# Patient Record
Sex: Female | Born: 1990 | Race: Black or African American | Hispanic: No | Marital: Single | Smoking: Never smoker
Health system: Southern US, Community
[De-identification: ages and names within clinical notes are randomized; demographics above are authoritative.]

## PROBLEM LIST (undated history)

## (undated) DIAGNOSIS — I10 Essential (primary) hypertension: Secondary | ICD-10-CM

## (undated) DIAGNOSIS — N730 Acute parametritis and pelvic cellulitis: Secondary | ICD-10-CM

## (undated) DIAGNOSIS — D649 Anemia, unspecified: Secondary | ICD-10-CM

## (undated) DIAGNOSIS — A7481 Chlamydial peritonitis: Secondary | ICD-10-CM

## (undated) DIAGNOSIS — A609 Anogenital herpesviral infection, unspecified: Secondary | ICD-10-CM

## (undated) DIAGNOSIS — A6000 Herpesviral infection of urogenital system, unspecified: Secondary | ICD-10-CM

## (undated) HISTORY — DX: Herpesviral infection of urogenital system, unspecified: A60.00

## (undated) HISTORY — DX: Anogenital herpesviral infection, unspecified: A60.9

## (undated) HISTORY — DX: Chlamydial peritonitis: A74.81

## (undated) HISTORY — DX: Acute parametritis and pelvic cellulitis: N73.0

---
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
## 2008-05-12 ENCOUNTER — Ambulatory Visit: Payer: Self-pay | Admitting: Internal Medicine

## 2008-05-12 DIAGNOSIS — R519 Headache, unspecified: Secondary | ICD-10-CM | POA: Insufficient documentation

## 2008-05-12 DIAGNOSIS — R51 Headache: Secondary | ICD-10-CM

## 2008-05-12 DIAGNOSIS — M79609 Pain in unspecified limb: Secondary | ICD-10-CM | POA: Insufficient documentation

## 2008-09-14 ENCOUNTER — Ambulatory Visit: Payer: Self-pay | Admitting: Internal Medicine

## 2009-05-15 ENCOUNTER — Telehealth (INDEPENDENT_AMBULATORY_CARE_PROVIDER_SITE_OTHER): Payer: Self-pay | Admitting: *Deleted

## 2009-05-30 ENCOUNTER — Ambulatory Visit: Payer: Self-pay | Admitting: Internal Medicine

## 2009-05-30 LAB — CONVERTED CEMR LAB: Anti Nuclear Antibody(ANA): NEGATIVE

## 2009-06-07 LAB — CONVERTED CEMR LAB
AST: 17 units/L (ref 0–37)
Albumin: 4.3 g/dL (ref 3.5–5.2)
Alkaline Phosphatase: 72 units/L (ref 39–117)
BUN: 9 mg/dL (ref 6–23)
Basophils Relative: 0 % (ref 0.0–3.0)
CO2: 30 meq/L (ref 19–32)
Calcium: 9.3 mg/dL (ref 8.4–10.5)
Chloride: 105 meq/L (ref 96–112)
Creatinine, Ser: 0.8 mg/dL (ref 0.4–1.2)
Eosinophils Relative: 10.2 % — ABNORMAL HIGH (ref 0.0–5.0)
Ferritin: 15 ng/mL (ref 10.0–291.0)
Folate: 10 ng/mL
Glucose, Bld: 82 mg/dL (ref 70–99)
HCT: 37.1 % (ref 36.0–46.0)
LDL Cholesterol: 89 mg/dL (ref 0–99)
Lymphs Abs: 1.8 10*3/uL (ref 0.7–4.0)
Monocytes Relative: 9.2 % (ref 3.0–12.0)
Neutrophils Relative %: 43.7 % (ref 43.0–77.0)
Platelets: 319 10*3/uL (ref 150.0–400.0)
RBC: 4.54 M/uL (ref 3.87–5.11)
Saturation Ratios: 27.2 % (ref 20.0–50.0)
Sed Rate: 13 mm/hr (ref 0–22)
TSH: 0.46 microintl units/mL (ref 0.35–5.50)
Total Bilirubin: 0.7 mg/dL (ref 0.3–1.2)
Total CHOL/HDL Ratio: 3
Triglycerides: 72 mg/dL (ref 0.0–149.0)
WBC: 4.8 10*3/uL (ref 4.5–10.5)

## 2009-08-21 ENCOUNTER — Ambulatory Visit: Payer: Self-pay | Admitting: Internal Medicine

## 2009-10-21 ENCOUNTER — Ambulatory Visit: Payer: Self-pay | Admitting: Internal Medicine

## 2010-01-26 ENCOUNTER — Ambulatory Visit: Payer: Self-pay | Admitting: Internal Medicine

## 2010-01-26 LAB — CONVERTED CEMR LAB
Nitrite: NEGATIVE
Urobilinogen, UA: 1

## 2010-07-04 ENCOUNTER — Telehealth: Payer: Self-pay | Admitting: Internal Medicine

## 2010-07-04 ENCOUNTER — Ambulatory Visit: Payer: Self-pay | Admitting: Family Medicine

## 2010-07-04 DIAGNOSIS — F430 Acute stress reaction: Secondary | ICD-10-CM | POA: Insufficient documentation

## 2010-07-06 ENCOUNTER — Encounter: Admission: RE | Admit: 2010-07-06 | Discharge: 2010-07-06 | Payer: Self-pay | Admitting: Family Medicine

## 2010-08-04 ENCOUNTER — Ambulatory Visit: Payer: Self-pay | Admitting: Family Medicine

## 2010-08-04 DIAGNOSIS — R319 Hematuria, unspecified: Secondary | ICD-10-CM

## 2010-08-04 LAB — CONVERTED CEMR LAB
Beta hcg, urine, semiquantitative: NEGATIVE
Glucose, Urine, Semiquant: NEGATIVE
Specific Gravity, Urine: 1.015
WBC Urine, dipstick: NEGATIVE
pH: 6.5

## 2010-08-23 ENCOUNTER — Telehealth: Payer: Self-pay | Admitting: *Deleted

## 2010-08-24 ENCOUNTER — Encounter: Payer: Self-pay | Admitting: Internal Medicine

## 2010-08-24 ENCOUNTER — Ambulatory Visit: Payer: Self-pay | Admitting: Family Medicine

## 2010-08-24 ENCOUNTER — Other Ambulatory Visit: Admission: RE | Admit: 2010-08-24 | Discharge: 2010-08-24 | Payer: Self-pay | Admitting: Family Medicine

## 2010-08-24 DIAGNOSIS — N926 Irregular menstruation, unspecified: Secondary | ICD-10-CM

## 2010-08-24 DIAGNOSIS — N921 Excessive and frequent menstruation with irregular cycle: Secondary | ICD-10-CM | POA: Insufficient documentation

## 2010-08-24 LAB — CONVERTED CEMR LAB
Basophils Absolute: 0 10*3/uL (ref 0.0–0.1)
Hemoglobin: 11.8 g/dL — ABNORMAL LOW (ref 12.0–15.0)
Lymphocytes Relative: 32.5 % (ref 12.0–46.0)
Monocytes Relative: 8.8 % (ref 3.0–12.0)
Neutrophils Relative %: 52.7 % (ref 43.0–77.0)
Platelets: 350 10*3/uL (ref 150.0–400.0)
RDW: 13.9 % (ref 11.5–14.6)

## 2010-08-25 ENCOUNTER — Encounter: Payer: Self-pay | Admitting: Family Medicine

## 2010-08-28 ENCOUNTER — Telehealth (INDEPENDENT_AMBULATORY_CARE_PROVIDER_SITE_OTHER): Payer: Self-pay | Admitting: *Deleted

## 2010-08-28 LAB — CONVERTED CEMR LAB
HIV: NONREACTIVE
Preg, Serum: NEGATIVE

## 2010-08-29 LAB — CONVERTED CEMR LAB

## 2010-10-01 ENCOUNTER — Ambulatory Visit: Payer: Self-pay | Admitting: Internal Medicine

## 2010-11-02 ENCOUNTER — Telehealth: Payer: Self-pay | Admitting: Internal Medicine

## 2010-11-02 ENCOUNTER — Ambulatory Visit
Admission: RE | Admit: 2010-11-02 | Discharge: 2010-11-02 | Payer: Self-pay | Source: Home / Self Care | Attending: Internal Medicine | Admitting: Internal Medicine

## 2010-11-02 DIAGNOSIS — A6000 Herpesviral infection of urogenital system, unspecified: Secondary | ICD-10-CM | POA: Insufficient documentation

## 2010-11-02 DIAGNOSIS — R1013 Epigastric pain: Secondary | ICD-10-CM | POA: Insufficient documentation

## 2010-11-02 DIAGNOSIS — J020 Streptococcal pharyngitis: Secondary | ICD-10-CM | POA: Insufficient documentation

## 2010-11-02 HISTORY — DX: Herpesviral infection of urogenital system, unspecified: A60.00

## 2010-11-02 LAB — CONVERTED CEMR LAB
Blood in Urine, dipstick: NEGATIVE
Nitrite: NEGATIVE
Protein, U semiquant: NEGATIVE
pH: 5.5

## 2010-11-03 ENCOUNTER — Encounter: Payer: Self-pay | Admitting: Internal Medicine

## 2010-11-06 ENCOUNTER — Encounter: Payer: Self-pay | Admitting: *Deleted

## 2010-12-02 LAB — CONVERTED CEMR LAB
Albumin: 3.9 g/dL (ref 3.5–5.2)
Alkaline Phosphatase: 57 units/L (ref 39–117)
Basophils Absolute: 0 10*3/uL (ref 0.0–0.1)
Calcium: 9 mg/dL (ref 8.4–10.5)
Eosinophils Absolute: 0.6 10*3/uL (ref 0.0–0.7)
Eosinophils Relative: 10.6 % — ABNORMAL HIGH (ref 0.0–5.0)
GFR calc non Af Amer: 136.07 mL/min (ref >60)
Glucose, Bld: 82 mg/dL (ref 70–99)
HCT: 34.5 % — ABNORMAL LOW (ref 36.0–46.0)
Lymphs Abs: 1.9 10*3/uL (ref 0.7–4.0)
MCHC: 32.6 g/dL (ref 30.0–36.0)
MCV: 83.9 fL (ref 78.0–100.0)
Monocytes Absolute: 0.4 10*3/uL (ref 0.1–1.0)
Platelets: 312 10*3/uL (ref 150.0–400.0)
RDW: 13.4 % (ref 11.5–14.6)
Sodium: 139 meq/L (ref 135–145)

## 2010-12-04 NOTE — Letter (Signed)
Summary: Out of Work  Adult nurse at [employer]  [address]   [place], [place] [address]   Phone: [phone]  Fax: [phone]    July 04, 2010   Employee:  MIKAELLA ESCALONA    To Whom It May Concern:   For Medical reasons, please excuse the above named employee from work for the following dates:  Start:    End:    If you need additional information, please feel free to contact our office.         Sincerely,    Danise Edge MD

## 2010-12-04 NOTE — Progress Notes (Signed)
Summary: [facility] called and said they are missing 2 test  Phone Note From Other Clinic Call back at [phone] option #2     Caller: [facility]      - Adina Summary of Call: [facility] called and says that they are missing wet prep and the Gonnorhea and chlamydia test. Pls advise.   Initial call taken by: Lucy Antigua,  August 28, 2010 9:27 AM  Follow-up for Phone Call        Will call [facility]? Not sure why they are waiting for this? This was all sent to [facility]. Follow-up by: Josph Macho RMA,  August 28, 2010 9:42 AM  Additional Follow-up for Phone Call Additional follow up Details #1::        Informed Adina to disregard this. Additional Follow-up by: Josph Macho RMA,  August 28, 2010 10:59 AM

## 2010-12-04 NOTE — Assessment & Plan Note (Signed)
Summary: CONJUCTIVITIS? // RS   Vital Signs:  Patient profile:   20 year old female Menstrual status:  regular LMP:     09/09/2010 Weight:      200 pounds O2 Sat:      98 % on Room air Temp:     99.0 degrees F oral Pulse rate:   70 / minute BP sitting:   100 / 70  (left arm) Cuff size:   regular  Vitals Entered By: Romualdo Bolk, CMA (AAMA) (October 01, 2010 3:56 PM)  O2 Flow:  Room air CC: Left eye watering, itching, some pain. This has been going on 11/24. LMP (date): 09/09/2010 LMP - Character: heavy Menarche (age onset years): 9   Menses interval (days): 21-28 Menstrual flow (days): 7 Enter LMP: 09/09/2010 Last PAP Result NEGATIVE FOR INTRAEPITHELIAL LESIONS OR MALIGNANCY.   History of Present Illness: Alyssa Lloyd comes in today   with her mom and sis whom are ill with  osre throat and fever for above.  She has had some redness and itching left eye as  above . SHe has had her contacts out for a week   ? new mascara  related   with left itchy lids  ..used visine . and moitsurizer .vision feels blurry  no  double vision..  NO fever uri signs or st.  rash  Preventive Screening-Counseling & Management  Alcohol-Tobacco     Smoking Status: never     Passive Smoke Exposure: no  Current Medications (verified): 1)  Ranitidine Hcl 150 Mg Caps (Ranitidine Hcl) .Marland Kitchen.. 1 Cap By Mouth Two Times A Day As Needed Reflux 2)  Alprazolam 0.25 Mg Tabs (Alprazolam) .... 1/2 To 1 Tab By Mouth Two Times A Day As Needed/anxiety/sob 3)  Valtrex 500 Mg Tabs (Valacyclovir Hcl) .Marland Kitchen.. 1 Daily  Allergies (verified): No Known Drug Allergies  Past History:  Past medical, surgical, family and social histories (including risk factors) reviewed for relevance to current acute and chronic problems.  Past Medical History: Reviewed history from 09/14/2008 and no changes required. non contibutory 5#9 oz FT    Past History:  Care Management: None Current  Family History: Reviewed  history from 05/30/2009 and no changes required. Fam hx HTN Fam hx Diabetes Fam hx Astma Fam hx Anemia Fam hx Allergies ? HAs   Neg RLS  Social History: Reviewed history from 01/26/2010 and no changes required. ? ets,  mom smokes  non smoker  works [employer]   Review of Systems  The patient denies anorexia, fever, weight loss, vision loss, decreased hearing, hoarseness, and chest pain.    Physical Exam  General:  Well-developed,well-nourished,in no acute distress; alert,appropriate and cooperative throughout examination Head:  normocephalic and atraumatic.   Eyes:  left eye  with 1 = redness and watery  discharge perrl and no photophobia  no ciliary flush Ears:  R ear normal and L ear normal.   Nose:  no nasal discharge.   Mouth:  pharynx pink and moist.   Neck:  No deformities, masses, or tenderness noted. Lungs:  normal respiratory effort and no intercostal retractions.   Cervical Nodes:  No lymphadenopathy noted Psych:  Oriented X3, good eye contact, not anxious appearing, and not depressed appearing.     Impression & Recommendations:  Problem # 1:  CONJUNCTIVITIS, LEFT (ICD-372.30) ? cause  ? allergic    ? keratitis but atypical for this.  rec   rx and observation .   see eye doc if persistent or  progressive  .  disc with mom also.  Complete Medication List: 1)  Ranitidine Hcl 150 Mg Caps (Ranitidine hcl) .Marland Kitchen.. 1 cap by mouth two times a day as needed reflux 2)  Alprazolam 0.25 Mg Tabs (Alprazolam) .... 1/2 to 1 tab by mouth two times a day as needed/anxiety/sob 3)  Valtrex 500 Mg Tabs (Valacyclovir hcl) .Marland Kitchen.. 1 daily 4)  Pataday 0.2 % Soln (Olopatadine hcl) .Marland Kitchen.. 1 qtt left eye  two times a day or as directed  Patient Instructions: 1)  no contacts until better . 2)  use allergy eye drops  If increasing pain or  infection looking then may need to see your eye doctor or return visit.  Prescriptions: PATADAY 0.2 % SOLN (OLOPATADINE HCL) 1 qtt left eye  two times a  day or as directed  #1 x 0   Entered and Authorized by:   Madelin Headings MD   Signed by:   Madelin Headings MD on 10/01/2010   Method used:   Electronically to        [facility]* (retail)       [address]       [address]       [place], [place]  [address]       Ph: [phone]       Fax: [phone]   RxID:   [id number]    Orders Added: 1)  Est. Patient Level III [84132]

## 2010-12-04 NOTE — Assessment & Plan Note (Signed)
Summary: chest pain/  pt went to [facility] ER/dm   Vital Signs:  Patient profile:   20 year old female Menstrual status:  regular Height:      63.5 inches (161.29 cm) Weight:      202 pounds (91.82 kg) BMI:     35.35 O2 Sat:      99 % on Room air Temp:     98.2 degrees F (36.78 degrees C) oral Pulse rate:   63 / minute BP sitting:   118 / 84  (left arm) Cuff size:   large  Vitals Entered By: Josph Macho RMA (July 04, 2010 10:08 AM)  O2 Flow:  Room air CC: Chest pain X5 days- left arm went numb , SOB/ CF Is Patient Diabetic? No   History of Present Illness: Patient in today for evaluation of some recent episodes of CP/SOB. She is accompanied by her mother. She reports the first episode was frightening and today's episode was frightening some milder episodes imbetween were not as frightening. The first episode occured at work on Saturday, she had just finished a shift that had been very stressful. She has just been given a promotion and some of her co workers were being harsh. She had a hard, sharp pain in her LUQ/lower anterior left CW. She then felt SOB/palp/numbness and tingling in her hands. She felt as if she could pass out, but she did not. Between today and then she has had much more mild symptoms only in the evening. Just noting somemild LUQ discomfort without the associated symptoms. She has a long history of Reflux with dyspepsiaand discomfort at times. It has been increased lately and she is drinking several caffeinated sodas daily, denies cigarette use, drinks small amounts of alcohol infrequently. Today she was at work again today when she had a hard pain, with SOB/palp and tingling in fingers again after another conflict with coworkers. No f/c/malaise/GI or GU c/o. No FH of early cardiac disease. Her mother did have a cholecystectomy in early 30s  Current Medications (verified): 1)  None  Allergies (verified): No Known Drug Allergies  Past History:  Past  medical history reviewed for relevance to current acute and chronic problems. Social history (including risk factors) reviewed for relevance to current acute and chronic problems.  Past Medical History: Reviewed history from 09/14/2008 and no changes required. non contibutory 5#9 oz FT    Social History: Reviewed history from 01/26/2010 and no changes required. ? ets,  mom smokes  non smoker  works Software engineer   Review of Systems      See HPI  Physical Exam  General:  Well-developed,well-nourished,in no acute distress; alert,appropriate and cooperative throughout examination Head:  Normocephalic and atraumatic without obvious abnormalities. No apparent alopecia or balding. Eyes:  No corneal or conjunctival inflammation noted. EOMI.  Ears:  External ear exam shows no significant lesions or deformities.  Otoscopic examination reveals clear canals, tympanic membranes are intact bilaterally without bulging, retraction, inflammation or discharge. Hearing is grossly normal bilaterally. Nose:  External nasal examination shows no deformity or inflammation. Nasal mucosa are pink and moist without lesions or exudates. Mouth:  Oral mucosa and oropharynx without lesions or exudates.  Teeth in good repair. Neck:  No deformities, masses, or tenderness noted. Lungs:  Normal respiratory effort, chest expands symmetrically. Lungs are clear to auscultation, no crackles or wheezes. Heart:  Normal rate and regular rhythm. S1 and S2 normal without gallop, murmur, click, rub or other extra sounds. Abdomen:  Bowel  sounds positive,abdomen soft and non-tender without masses, organomegaly or hernias noted. Msk:  No deformity or scoliosis noted of thoracic or lumbar spine.   Pulses:  R and L,dorsalis pedis and posterior tibial pulses are full and equal bilaterally Extremities:  No clubbing, cyanosis, edema, or deformity noted  Cervical Nodes:  No lymphadenopathy noted Psych:  Cognition and judgment appear  intact. Alert and cooperative with normal attention span and concentration. No apparent delusions, illusions, hallucinations   Impression & Recommendations:  Problem # 1:  ABDOMINAL PAIN, EPIGASTRIC (ICD-789.06)  Orders: TLB-CBC Platelet - w/Differential (85025-CBCD) TLB-BMP (Basic Metabolic Panel-BMET) (80048-METABOL) TLB-Hepatic/Liver Function Pnl (80076-HEPATIC) TLB-TSH (Thyroid Stimulating Hormone) (84443-TSH) TLB-H. Pylori Abs(Helicobacter Pylori) (86677-HELICO) Radiology Referral (Radiology), check an Ultrasound of RUQ, mother with early gallbladder disease Specimen Handling ([address]) Venipuncture ([address])  Problem # 2:  SHORTNESS OF BREATH (ICD-786.05)  Orders: TLB-BMP (Basic Metabolic Panel-BMET) (80048-METABOL) TLB-Hepatic/Liver Function Pnl (80076-HEPATIC) TLB-TSH (Thyroid Stimulating Hormone) (84443-TSH) Specimen Handling ([address]) Venipuncture (91478) Likely related to anxiety but will report worsening symptoms  Problem # 3:  STRESS REACTION, ACUTE (ICD-308.9) Likely situational and counselled at length regarding the need for life planning and decrease of stressors wehre ever possible. Given just 10 alprazolam to use as needed for panic moments. Warned regarding the need to use them sparingly due to habituation potential  Complete Medication List: 1)  Ranitidine Hcl 150 Mg Caps (Ranitidine hcl) .Marland Kitchen.. 1 cap by mouth two times a day as needed reflux 2)  Alprazolam 0.25 Mg Tabs (Alprazolam) .... 1/2 to 1 tab by mouth two times a day as needed/anxiety/sob 3)  Zofran Odt 4 Mg Tbdp (Ondansetron) .Marland Kitchen.. 1 tab by mouth three times a day as needed n/v Prescriptions: ZOFRAN ODT 4 MG TBDP (ONDANSETRON) 1 tab by mouth three times a day as needed n/v  #30 x 1   Entered and Authorized by:   Danise Edge MD   Signed by:   Danise Edge MD on 07/04/2010   Method used:   Electronically to        [facility]* (retail)       [address]       [address]  Shipping/mailing       [place], [place]  [address]       Ph: [phone]       Fax: [phone]   RxID:   [id number] ALPRAZOLAM 0.25 MG TABS (ALPRAZOLAM) 1/2 to 1 tab by mouth two times a day as needed/anxiety/sob  #10 x 0   Entered and Authorized by:   Danise Edge MD   Signed by:   Danise Edge MD on 07/04/2010   Method used:   Print then Give to Patient   RxID:   [id number] RANITIDINE HCL 150 MG CAPS (RANITIDINE HCL) 1 cap by mouth two times a day as needed reflux  #60 x 1   Entered and Authorized by:   Danise Edge MD   Signed by:   Danise Edge MD on 07/04/2010   Method used:   Electronically to        [facility]* (retail)       [address]       [address]mail[address]       [place], [place]  [address]       Ph: [phone]       Fax: [phone]   RxID:   [id number]

## 2010-12-04 NOTE — Assessment & Plan Note (Signed)
Summary: stomach pain/njr   Vital Signs:  Patient profile:   20 year old female Menstrual status:  regular Height:      63.5 inches (161.29 cm) Weight:      198 pounds (90.00 kg) O2 Sat:      99 % on Room air Temp:     98.0 degrees F (36.67 degrees C) oral Pulse rate:   52 / minute BP sitting:   118 / 80  (left arm) Cuff size:   large  Vitals Entered By: Josph Macho RMA (August 04, 2010 9:31 AM)  O2 Flow:  Room air CC: Stomach pains X1week, nausea, vomited a couple times, bowel movements-normal, lower abd/ CF Is Patient Diabetic? No   History of Present Illness: Patient in today for evaluation of symptoms she has had for about a week. She started with n/v a week ago. The vomitting stopped but she is having lower abdominal pain. Notes her bowels are moving well, no bloody or tarry stool. She does note some mild dysuria, suprapubic pain. Has had some sensation of low grade fevers/chills. She does not notice any CP/palp/SOB/congestion. Her LMP was 2 weeks ago was shorter and lighter than usual at just 4 days instead of 7 she did have unprotected sex about 2 1/2 weeks ago. She continues to feel nauseous and her last episode of vomitting was roughly 3 days ago. She reports some scant blood on the tissure when she wipes of and on this week.  Current Medications (verified): 1)  Ranitidine Hcl 150 Mg Caps (Ranitidine Hcl) .Marland Kitchen.. 1 Cap By Mouth Two Times A Day As Needed Reflux 2)  Alprazolam 0.25 Mg Tabs (Alprazolam) .... 1/2 To 1 Tab By Mouth Two Times A Day As Needed/anxiety/sob 3)  Zofran Odt 4 Mg Tbdp (Ondansetron) .Marland Kitchen.. 1 Tab By Mouth Three Times A Day As Needed N/v  Allergies (verified): No Known Drug Allergies  Past History:  Past medical history reviewed for relevance to current acute and chronic problems. Social history (including risk factors) reviewed for relevance to current acute and chronic problems.  Past Medical History: Reviewed history from 09/14/2008 and no changes  required. non contibutory 5#9 oz FT    Social History: Reviewed history from 01/26/2010 and no changes required. ? ets,  mom smokes  non smoker  works Software engineer   Review of Systems      See HPI  Physical Exam  General:  Well-developed,well-nourished,in no acute distress; alert,appropriate and cooperative throughout examination Head:  Normocephalic and atraumatic without obvious abnormalities. No apparent alopecia or balding. Mouth:  Oral mucosa and oropharynx without lesions or exudates.  Teeth in good repair. slightly dry mucus membranes Neck:  No deformities, masses, or tenderness noted. Lungs:  Normal respiratory effort, chest expands symmetrically. Lungs are clear to auscultation, no crackles or wheezes. Heart:  Normal rate and regular rhythm. S1 and S2 normal without gallop, murmur, click, rub or other extra sounds. Abdomen:  Bowel sounds positive,abdomen soft and non-tender without masses, organomegaly or hernias noted. Extremities:  No clubbing, cyanosis, edema, or deformity noted with normal full range of motion of all joints.   Psych:  Cognition and judgment appear intact. Alert and cooperative with normal attention span and concentration. No apparent delusions, illusions, hallucinations   Impression & Recommendations:  Problem # 1:  HEMATURIA UNSPECIFIED (ICD-599.70)  Her updated medication list for this problem includes:    Macrobid 100 Mg Caps (Nitrofurantoin monohyd macro) .Marland Kitchen... 1 cap by mouth two times a day x  5 days Needs to push clear fluids due to mild dehydration  Problem # 2:  NAUSEA AND VOMITING (ICD-787.01) Reminded that she has Zofran on hold at the pharmacy if she needs it needs to maintain a bland diet for next one to two days  Complete Medication List: 1)  Ranitidine Hcl 150 Mg Caps (Ranitidine hcl) .Marland Kitchen.. 1 cap by mouth two times a day as needed reflux 2)  Alprazolam 0.25 Mg Tabs (Alprazolam) .... 1/2 to 1 tab by mouth two times a day as  needed/anxiety/sob 3)  Zofran Odt 4 Mg Tbdp (Ondansetron) .Marland Kitchen.. 1 tab by mouth three times a day as needed n/v 4)  Macrobid 100 Mg Caps (Nitrofurantoin monohyd macro) .Marland Kitchen.. 1 cap by mouth two times a day x 5 days  Patient Instructions: 1)  Drink clear liquids only for the next 24 hours, then slowly add other liquids and food as you tolerate them .  2)  Drink plenty of fluids up to 3-4 quarts a day. Cranberry juice is especially recommended in addition to large amounts of water. Avoid caffeine & carbonated drinks, they tend to irritate the bladder, Return in 3-5 days if you're not better: sooner if you're feeling worse.  3)  Take your antibiotic as prescribed until ALL of it is gone, but stop if you develop a rash or swelling and contact our office as soon as possible.  4)  Take a yogurt or probiotic daily while on antibiotics Prescriptions: MACROBID 100 MG CAPS (NITROFURANTOIN MONOHYD MACRO) 1 cap by mouth two times a day x 5 days  #10 x 0   Entered and Authorized by:   Danise Edge MD   Signed by:   Danise Edge MD on 08/04/2010   Method used:   Electronically to        [facility] [phone]* (retail)       [address]       [place], [place]  [address]       Ph: [phone]       Fax: [phone]   RxID:   [id number]   Laboratory Results   Urine Tests    Routine Urinalysis   Color: yellow Appearance: Clear Glucose: negative   (Normal Range: Negative) Bilirubin: negative   (Normal Range: Negative) Ketone: moderate (40)   (Normal Range: Negative) Spec. Gravity: 1.015   (Normal Range: 1.003-1.035) Blood: large   (Normal Range: Negative) pH: 6.5   (Normal Range: 5.0-8.0) Protein: +30   (Normal Range: Negative) Urobilinogen: 0.2   (Normal Range: 0-1) Nitrite: negative   (Normal Range: Negative) Leukocyte Esterace: negative   (Normal Range: Negative)    Urine HCG: negative

## 2010-12-04 NOTE — Assessment & Plan Note (Signed)
Summary: wants to get tested//ccm   Vital Signs:  Patient profile:   20 year old female Menstrual status:  regular Height:      63.5 inches (161.29 cm) Weight:      196 pounds (89.09 kg) O2 Sat:      98 % on Room air Temp:     98.1 degrees F (36.72 degrees C) oral Pulse rate:   68 / minute BP sitting:   108 / 78  (left arm) Cuff size:   large  Vitals Entered By: Josph Macho RMA (August 24, 2010 9:11 AM)  O2 Flow:  Room air CC: Wants to get tested for vaginal infection - itching, sore, burning, discharge (whitish-yellow) X5 days/ CF Is Patient Diabetic? No   History of Present Illness: 20 yo AA female in today for evaluation of vaginitis. She has had 5 days worth of symptoms. She reports she was sexually active with a old partner and did not use protection and she has been having symptoms of vaginal discharge, irritation and itching since then. She notes some burning wheh she urinates and the urine hits the vaginal mucosa. No urinary frequency or urgency. No hematura. She reports her period started on 10/16 and ended on 10/19, she notes it was slightly heavier than usual and stopped more abruptly than usual. Her LMP prior to that she believes was some time in ealry September and was normal. She generally has once monthly flow of 5-7 days, moderate in intensity. She denies any abdominal or back pain. No f/c/vomitting or fatigue. She does note some intermittent nausea but reprots that has been going on for months and occurs randomly. She tried Vagisil for the discharge and notes it burned so she stopeed. She had two small tender lesions develop on her left labia majora after she used the vagisil and they are improving since she stopped it. No other recent illness/congestion/SOB/CP/palp.  Current Medications (verified): 1)  Ranitidine Hcl 150 Mg Caps (Ranitidine Hcl) .Marland Kitchen.. 1 Cap By Mouth Two Times A Day As Needed Reflux 2)  Alprazolam 0.25 Mg Tabs (Alprazolam) .... 1/2 To 1 Tab By Mouth  Two Times A Day As Needed/anxiety/sob  Allergies (verified): No Known Drug Allergies  Past History:  Past medical history reviewed for relevance to current acute and chronic problems. Social history (including risk factors) reviewed for relevance to current acute and chronic problems.  Past Medical History: Reviewed history from 09/14/2008 and no changes required. non contibutory 5#9 oz FT    Social History: Reviewed history from 01/26/2010 and no changes required. ? ets,  mom smokes  non smoker  works Software engineer   Review of Systems      See HPI  Physical Exam  General:  Well-developed,well-nourished,in no acute distress; alert,appropriate and cooperative throughout examination Head:  Normocephalic and atraumatic without obvious abnormalities. No apparent alopecia or balding. Mouth:  Oral mucosa and oropharynx without lesions or exudates.  Teeth in good repair. Neck:  No deformities, masses, or tenderness noted. Lungs:  Normal respiratory effort, chest expands symmetrically. Lungs are clear to auscultation, no crackles or wheezes. Heart:  Normal rate and regular rhythm. S1 and S2 normal without gallop, murmur, click, rub or other extra sounds. Abdomen:  Bowel sounds positive,abdomen soft and non-tender without masses, organomegaly or hernias noted. Genitalia:  normal introitus, normal uterus size and position, labial ulcer(s), and vaginal discharge thin, yellow. Small lesion, open on left labia majora, mildly tender. Cervix erythematousand mildly irritated. No CMT, no adnexal masses.  Impression & Recommendations:  Problem # 1:  IRREGULAR MENSES (ICD-626.4)  Orders: Pregnancy Test, serum qualitative (11914) T-Pregnancy (Serum), Qual.  [phone]) Await results encouraged condom use and reconsider further birth control  Problem # 2:  LESION, VAGINA (ICD-236.3)  Orders: HSV, Genetic virus isolate ([id number]) T-HSV, Genetic virus isolate  [phone]) T-Pregnancy (Serum), Qual.  (01027-25366) encouraged condom use  Problem # 3:  VAGINITIS (ICD-616.10)  The following medications were removed from the medication list:    Macrobid 100 Mg Caps (Nitrofurantoin monohyd macro) .Marland Kitchen... 1 cap by mouth two times a day x 5 days Her updated medication list for this problem includes:    Metronidazole 500 Mg Tabs (Metronidazole) .Marland Kitchen... 1 tab by mouth two times a day x 7 days  Orders: TLB-CBC Platelet - w/Differential (85025-CBCD) T-GC Probe, genital (44034-74259) T-Chlamydia Probe, genital (56387-56433) T-HIV Antibody  (Reflex) ([id number]) T-Wet Prep (06301-60109) Await further results, start a probiotic and avoid harsh soaps  Complete Medication List: 1)  Ranitidine Hcl 150 Mg Caps (Ranitidine hcl) .Marland Kitchen.. 1 cap by mouth two times a day as needed reflux 2)  Alprazolam 0.25 Mg Tabs (Alprazolam) .... 1/2 to 1 tab by mouth two times a day as needed/anxiety/sob 3)  Fluconazole 150 Mg Tabs (Fluconazole) .Marland Kitchen.. 1 tab by mouth once 4)  Metronidazole 500 Mg Tabs (Metronidazole) .Marland Kitchen.. 1 tab by mouth two times a day x 7 days  Other Orders: UA Dipstick w/o Micro (automated)  (81003)  Patient Instructions: 1)  Please schedule a follow-up appointment as needed if symptoms worsen or do not improve. 2)  Eat a yogurt daily or take a probiotic such as Align caps daily for the next month, cotton undergarments, hypoallergenic no perfume soaps, use only on external skin. 3)  If you could be exposed to sexually transmitted diseases. you should use a condom.  Prescriptions: METRONIDAZOLE 500 MG TABS (METRONIDAZOLE) 1 tab by mouth two times a day x 7 days  #14 x 0   Entered and Authorized by:   Danise Edge MD   Signed by:   Danise Edge MD on 08/24/2010   Method used:   Electronically to        [facility]* (retail)       [address]       [address] Shipping[address]mail[address]       [place], [place]  [address]       Ph: [phone]        Fax: [phone]   RxID:   [id number] FLUCONAZOLE 150 MG TABS (FLUCONAZOLE) 1 tab by mouth once  #1 x 1   Entered and Authorized by:   Danise Edge MD   Signed by:   Danise Edge MD on 08/24/2010   Method used:   Electronically to        [facility]* (retail)       [address]       [address] Shipping[address]mail[address]       [place], [place]  [address]       Ph: [phone]       Fax: [phone]   RxID:   [id number]    Orders Added: 1)  UA Dipstick w/o Micro (automated)  [81003] 2)  TLB-CBC Platelet - w/Differential [85025-CBCD] 3)  T-GC Probe, genital [phone] 4)  T-Chlamydia Probe, genital [36144-31540] 5)  T-HIV Antibody  (Reflex) [[id number]] 6)  T-Wet Prep [32671-24580] 7)  HSV, Genetic virus isolate [[id number]] 8)  Pregnancy Test, serum qualitative [39767]  9)  T-HSV, Genetic virus isolate [[id number]] 10)  T-Pregnancy (Serum), Qual.  [84703-23895] 11)  Est. Patient Level IV [82956]

## 2010-12-04 NOTE — Assessment & Plan Note (Signed)
Summary: COUGH, CONGESTION, N/V // RS   Vital Signs:  Patient profile:   20 year old female Menstrual status:  regular LMP:     01/18/2010 Weight:      195 pounds Temp:     98.9 degrees F oral Pulse rate:   78 / minute BP sitting:   120 / 80  (right arm) Cuff size:   regular  Vitals Entered By: Romualdo Bolk, CMA Duncan Dull) (January 26, 2010 1:38 PM) CC: Vomiting, coughing, ha's that started on 3/22. Pt has been able to keep fluids and crackers down. Pt has a itchy and tight throat. LMP (date): 01/18/2010 LMP - Character: heavy Menarche (age onset years): 9   Menses interval (days): 21-28 Menstrual flow (days): 7 Enter LMP: 01/18/2010   History of Present Illness: Alyssa Lloyd comesin with mom  for  above problem with onset of  tight throat right  and then coughing  then rhinorrhea and then vomiting  for the last few days of and on . Taking in fluids not solids .LAst bm today and no change in symptom .  Abd pain comes and goes? LMP lighter.  Low grade fever yesterday .  Mom with GE  about 3 weeks ago.   NO meds . Works [employer] and sent hoome from work.  Preventive Screening-Counseling & Management  Alcohol-Tobacco     Smoking Status: never     Passive Smoke Exposure: no  Current Medications (verified): 1)  None  Allergies (verified): No Known Drug Allergies  Past History:  Past medical, surgical, family and social histories (including risk factors) reviewed for relevance to current acute and chronic problems.  Past Medical History: Reviewed history from 09/14/2008 and no changes required. non contibutory 5#9 oz FT    Past History:  Care Management: None Current  Family History: Reviewed history from 05/30/2009 and no changes required. Fam hx HTN Fam hx Diabetes Fam hx Astma Fam hx Anemia Fam hx Allergies ? HAs   Neg RLS  Social History: Reviewed history from 05/30/2009 and no changes required. ? ets,  mom smokes  non smoker  works  [employer]   Review of Systems       The patient complains of anorexia.  The patient denies weight gain, vision loss, decreased hearing, chest pain, syncope, dyspnea on exertion, peripheral edema, headaches, hemoptysis, melena, hematochezia, severe indigestion/heartburn, hematuria, incontinence, genital sores, muscle weakness, difficulty walking, depression, unusual weight change, abnormal bleeding, enlarged lymph nodes, and angioedema.    Physical Exam  General:  mildly ill in nad   alert Head:  normocephalic and atraumatic.   Eyes:  vision grossly intact, pupils equal, and pupils round.   Ears:  R ear normal and L ear normal.   Nose:  no external deformity, no external erythema, and no nasal discharge.   Mouth:  Oral mucosa and oropharynx without lesions or exudates.  Teeth in good repair. tongue ring Neck:  No deformities, masses, or tenderness noted. Lungs:  Normal respiratory effort, chest expands symmetrically. Lungs are clear to auscultation, no crackles or wheezes.no dullness.   Heart:  Normal rate and regular rhythm. S1 and S2 normal without gallop, murmur, click, rub or other extra sounds. Abdomen:  soft, non-tender, no distention, no masses, no guarding, no rigidity, no hepatomegaly, and no splenomegaly.  slight decrease in BS  Msk:  no joint swelling, no joint warmth, and no redness over joints.   Pulses:  nl cap refill  Extremities:  no clubbing cyanosis or  edema  Neurologic:  alert & oriented X3, strength normal in all extremities, and gait normal.   Skin:  turgor normal, color normal, no petechiae, and no purpura.   Cervical Nodes:  no anterior cervical adenopathy and no posterior cervical adenopathy.   Psych:  Oriented X3, not anxious appearing, and not depressed appearing.     Impression & Recommendations:  Problem # 1:  NAUSEA AND VOMITING (ICD-787.01) seems related to below    minimal dehydration  i think abd pain secondary not primary and no evidence of acut  abdomen currently.  disc alarm symptom to look for.   note for work  Problem # 2:  URI (ICD-465.9) seems viral  Her updated medication list for this problem includes:    Promethazine Hcl 25 Mg Tabs (Promethazine hcl) .Marland Kitchen... 1 by mouth q4-6 hours as needed nausea and vomiting.  Complete Medication List: 1)  Promethazine Hcl 25 Mg Tabs (Promethazine hcl) .Marland Kitchen.. 1 by mouth q4-6 hours as needed nausea and vomiting.  Patient Instructions: 1)  this is probably  a viral illness  2)  urine is ok  3)  continue  hydration .  4)  No work  for 2 days and if still not improved call ( go back monday)  5)  med for nausea as needed can make your drowsy.  Prescriptions: PROMETHAZINE HCL 25 MG TABS (PROMETHAZINE HCL) 1 by mouth q4-6 hours as needed nausea and vomiting.  #15 x 0   Entered and Authorized by:   Madelin Headings MD   Signed by:   Madelin Headings MD on 01/26/2010   Method used:   Electronically to        [facility]* (retail)       [address]       [address] [place] [address]       [place], [place]  [address]       Ph: [phone]       Fax: [phone]   RxID:   [id number]   Laboratory Results   Urine Tests    Routine Urinalysis   Color: yellow Appearance: Clear Glucose: negative   (Normal Range: Negative) Bilirubin: negative   (Normal Range: Negative) Ketone: negative   (Normal Range: Negative) Spec. Gravity: 1.015   (Normal Range: 1.003-1.035) Blood: negative   (Normal Range: Negative) pH: 7.5   (Normal Range: 5.0-8.0) Protein: negative   (Normal Range: Negative) Urobilinogen: 1.0   (Normal Range: 0-1) Nitrite: negative   (Normal Range: Negative) Leukocyte Esterace: trace   (Normal Range: Negative)    Urine HCG: negative Comments: [name]  January 26, 2010 2:22 PM

## 2010-12-04 NOTE — Progress Notes (Signed)
Summary: appt with Dr. Abner Greenspan  Phone Note From Other Clinic   Caller: [facility] Call For: Dr Fabian Sharp Summary of Call: [facility] called with this pt at the ER complaining of chest pain.  Requesting we see the pt in the office.  Dr. Fabian Sharp is full, ....Marland KitchenMarland Kitchenscheduled with Dr. Abner Greenspan. Initial call taken by: Lynann Beaver CMA,  July 04, 2010 9:30 AM

## 2010-12-04 NOTE — Letter (Signed)
Summary: Out of Work   at [facility]  [address]   [place] [address]   Phone: [phone]  Fax: [phone]    July 04, 2010   Employee:  Nechama B Rayburn    To Whom It May Concern:   For Medical reasons, please excuse the above named employee from work for the following dates:  Start:   07/04/2010  End:   07/05/2010 may return to work on 07/06/2010 If you need additional information, please feel free to contact our office.         Sincerely,    Kalin Amrhein MD 

## 2010-12-04 NOTE — Letter (Signed)
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  If you need additional information, please feel free to contact our office.         Sincerely,    Madelin Headings MD

## 2010-12-04 NOTE — Progress Notes (Signed)
Summary: vaginal itching  Phone Note Call from Patient Call back at Home Phone [phone]   Caller: Patient Summary of Call: Pt is having some itching, burning, some vaginal discharge that is white. This started 4 days ago. Pt has tried vagisil OTC. Initial call taken by: Romualdo Bolk, CMA Duncan Dull),  August 23, 2010 11:59 AM  Follow-up for Phone Call        ok  appt tomorrow with Dr Abner Greenspan Follow-up by: Madelin Headings MD,  August 23, 2010 12:40 PM  Additional Follow-up for Phone Call Additional follow up Details #1::        Left message on pt's cell that she could keep her appt with Dr. Abner Greenspan or schedule an appt with Dr. Fabian Sharp tomorrow afternoon. Additional Follow-up by: Romualdo Bolk, CMA (AAMA),  August 23, 2010 12:47 PM     Appended Document: vaginal itching Pt called back and can't come in the afternoon. She will keep her appt with Dr. Abner Greenspan

## 2010-12-04 NOTE — Letter (Signed)
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   [place], [place] [address]   Phone: [phone]  Fax: [phone]    July 04, 2010   Employee:  DIANELLY FERRAN    To Whom It May Concern:   For Medical reasons, please excuse the above named employee from work for the following dates:  Start:   07/04/2010  End:   07/05/2010 may return to work on 07/06/2010 If you need additional information, please feel free to contact our office.         Sincerely,    Danise Edge MD

## 2010-12-04 NOTE — Letter (Signed)
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         Sincerely,    Danise Edge MD

## 2010-12-06 NOTE — Assessment & Plan Note (Signed)
Summary: pain in side/cjr   Vital Signs:  Patient profile:   20 year old female Menstrual status:  regular Weight:      203 pounds O2 Sat:      98 % on Room air Temp:     98.3 degrees F oral Pulse rate:   65 / minute Pulse rhythm:   regular BP sitting:   120 / 80  (right arm) Cuff size:   regular  Vitals Entered By: Duard Brady LPN (November 02, 2010 2:53 PM)  O2 Flow:  Room air CC: c/o head congestion,ear pain, stomach pain, nausea Is Patient Diabetic? No   History of Present Illness: Alyssa Lloyd  comes in today  for acute visit with mom and sib for above.  Co fo uri ear pain and ome cingestion but no fever.  ? hurts to swallow .  feels bad and tired .  no uti signs  Has nausea and body aches  She has battled some  abd pain for a while but this pain seems ? different than when had  eval byt Dr Abner Greenspan a month or so ago. Had pos HSV rst test negative .Marland Kitchen   Is on ranitidine.   Describes this abd pain as severe 9/10 and intermittent and tight in epigastric area. NO vomiting  ( neg [place]  in past)  Has hx of allergies  and poss gerd . Sib has cough and st and congesteion and dx with mono. she had strep a few months ago.  Preventive Screening-Counseling & Management  Alcohol-Tobacco     Smoking Status: never     Passive Smoke Exposure: no  Allergies (verified): No Known Drug Allergies  Past History:  Past medical, surgical, family and social histories (including risk factors) reviewed, and no changes noted (except as noted below).  Past Medical History: non contibutory 5#9 oz FT HSV     Family History: Reviewed history from 05/30/2009 and no changes required. Fam hx HTN Fam hx Diabetes Fam hx Astma Fam hx Anemia Fam hx Allergies ? HAs   Neg RLS Mom had gall bladder disease   Social History: Reviewed history from 01/26/2010 and no changes required. ? ets,  mom smokes  non smoker  works [employer]  parents recenlty separated  divorcing  Review  of Systems       The patient complains of anorexia, fever, and abdominal pain.  The patient denies vision loss, decreased hearing, hoarseness, chest pain, syncope, prolonged cough, melena, hematochezia, severe indigestion/heartburn, hematuria, genital sores, difficulty walking, abnormal bleeding, and angioedema.    Physical Exam  General:  midly ill laying down in nad  Head:  Normocephalic and atraumatic without obvious abnormalities. No apparent alopecia or balding. Eyes:  clear  Ears:  R ear normal, L ear normal, and no external deformities.   Nose:  no external deformity and no external erythema.  mild congestion Mouth:  1+ red tonsil 1+ no exudate   good airway Neck:  tenderac nodes mildy enlarged  Lungs:  Normal respiratory effort, chest expands symmetrically. Lungs are clear to auscultation, no crackles or wheezes. Heart:  Normal rate and regular rhythm. S1 and S2 normal without gallop, murmur, click, rub or other extra sounds. Abdomen:  Bowel sounds positive,abdomen soft and non-tender without masses, organomegaly or hernias noted.  points to epigastrumas area of concern  no gor r  Pulses:  nl cpa refill  Neurologic:  non focal  Skin:  turgor normal, color normal, no ecchymoses, and no petechiae.  Cervical Nodes:  tender ac neg pc  Psych:  Oriented X3, good eye contact, not anxious appearing, and not depressed appearing.     Impression & Recommendations:  Problem # 1:  PHARYNGITIS, STREPTOCOCCAL (ICD-034.0)  Her updated medication list for this problem includes:    Amoxicillin 500 Mg Caps (Amoxicillin) .Marland Kitchen... 1 by mouth three times a day  Instructed to complete antibiotics and call if not improved in 48 hours.   Problem # 2:  ABDOMINAL PAIN, EPIGASTRIC (ICD-789.06) ? if related to above or previous ...   had eval and ro pelvc issue and neg [place] in past months  will readress if continutes after this illness rx .  consider ulser disase  consider  alse a biliary scan   with fam hx  and poss lactose intolerance etc.  Orders: Rapid Strep ([address]) UA Dipstick w/o Micro (automated)  (81003) T-Culture, Urine ([id number])  Problem # 3:  abnormal ua  check uc   Complete Medication List: 1)  Ranitidine Hcl 150 Mg Caps (Ranitidine hcl) .Marland Kitchen.. 1 cap by mouth two times a day as needed reflux 2)  Alprazolam 0.25 Mg Tabs (Alprazolam) .... 1/2 to 1 tab by mouth two times a day as needed/anxiety/sob 3)  Valtrex 500 Mg Tabs (Valacyclovir hcl) .Marland Kitchen.. 1 daily 4)  Pataday 0.2 % Soln (Olopatadine hcl) .Marland Kitchen.. 1 qtt left eye  two times a day or as directed 5)  Amoxicillin 500 Mg Caps (Amoxicillin) .Marland Kitchen.. 1 by mouth three times a day  Patient Instructions: 1)  take antibioitc for strep throat . will call about urine culture . 2)  after infection is better then  return office visit about stomach pain ( unless better)to see if needs further workup. Prescriptions: AMOXICILLIN 500 MG CAPS (AMOXICILLIN) 1 by mouth three times a day  #30 x 0   Entered and Authorized by:   Madelin Headings MD   Signed by:   Madelin Headings MD on 11/02/2010   Method used:   Electronically to        [facility]* (retail)       [address]       [address] Shipping/mailing       [place], [place]  [address]       Ph: [phone]       Fax: [phone]   RxID:   [id number]    Orders Added: 1)  Rapid Strep [66440] 2)  UA Dipstick w/o Micro (automated)  [81003] 3)  T-Culture, Urine [[id number]] 4)  Est. Patient Level IV [87564]    Laboratory Results   Urine Tests    Routine Urinalysis   Color: yellow Appearance: Clear Glucose: negative   (Normal Range: Negative) Bilirubin: negative   (Normal Range: Negative) Ketone: negative   (Normal Range: Negative) Spec. Gravity: 1.015   (Normal Range: 1.003-1.035) Blood: negative   (Normal Range: Negative) pH: 5.5   (Normal Range: 5.0-8.0) Protein: negative   (Normal Range: Negative) Urobilinogen: 0.2   (Normal Range: 0-1) Nitrite:  negative   (Normal Range: Negative) Leukocyte Esterace: 2+   (Normal Range: Negative)    Urine HCG: negative Comments: [name]  November 02, 2010 4:04 PM    Other Tests  Rapid Strep: positive Comments: [name]  November 02, 2010 3:54 PM   Kit Test Internal QC: Positive   (Normal Range: Negative)

## 2010-12-06 NOTE — Letter (Signed)
Summary: Generic Letter  [place] [facility]  [address] [place], [place] [address]   Phone: [phone]  Fax: [phone]    11/06/2010  Alyssa Lloyd [address] [place], [place]  [address]  Dear Alyssa Lloyd,  Your urine culture shows  mutiple  kinds of bacteria  ( contaminated specimen). If not geting better we should repeat with a good clean catch  however if getting better no need to repeat.  Please give our office a call if you have any questions.         Sincerely,   Tor Netters, CMA (AAMA)

## 2010-12-06 NOTE — Progress Notes (Signed)
Summary: all over abd. pain  Phone Note Outgoing Call   Call placed by: Romualdo Bolk, CMA (AAMA),  November 02, 2010 9:12 AM Call placed to: Patient Summary of Call: Pt is having sharp pain in her stomach, nausea, flu like symptoms. Mom states that she feels clammy and states that pt states that it is just her stomach. She is also having some coughing and congesiton. Initial call taken by: Romualdo Bolk, CMA (AAMA),  November 02, 2010 9:14 AM

## 2011-01-08 ENCOUNTER — Other Ambulatory Visit: Payer: Self-pay | Admitting: Internal Medicine

## 2011-01-08 ENCOUNTER — Ambulatory Visit (INDEPENDENT_AMBULATORY_CARE_PROVIDER_SITE_OTHER): Payer: 59 | Admitting: Internal Medicine

## 2011-01-08 ENCOUNTER — Encounter: Payer: Self-pay | Admitting: Internal Medicine

## 2011-01-08 DIAGNOSIS — N926 Irregular menstruation, unspecified: Secondary | ICD-10-CM

## 2011-01-08 DIAGNOSIS — R109 Unspecified abdominal pain: Secondary | ICD-10-CM | POA: Insufficient documentation

## 2011-01-08 LAB — POCT URINALYSIS DIPSTICK
Bilirubin, UA: NEGATIVE
Ketones, UA: NEGATIVE
Nitrite, UA: NEGATIVE
Protein, UA: NEGATIVE

## 2011-01-08 MED ORDER — CIPROFLOXACIN HCL 500 MG PO TABS: 500.0000 mg | ORAL_TABLET | Freq: Two times a day (BID) | ORAL | Status: AC

## 2011-01-08 NOTE — Patient Instructions (Signed)
Take an acid blocker every day. antibiotic for possible urine infection. return office visit in 2 weeks and if not better we may repeat the ultrasound of your gallbladder

## 2011-01-08 NOTE — Assessment & Plan Note (Signed)
ruq  A bit atypical onset agfter a burp some character with pulled muscle but has some anorexia with this.  Will check for UTI and chlamydia and  Add her acid blocker back and close follow up .  ucg is neg today

## 2011-01-09 ENCOUNTER — Encounter: Payer: Self-pay | Admitting: Internal Medicine

## 2011-01-09 NOTE — Progress Notes (Signed)
  Subjective:    Patient ID: Alyssa Lloyd, female    DOB: [birth year], 20 y.o.   MRN: [id number]  HPI  patient comes in today for an acute visit for a almost 2 week history of upper to right sided abdominal pain. She noted the onset after burping and hiccuping and had pain on the right upper side. Since that time it persists it hurts to move and cough she has no nausea and vomiting although does have decreased appetite. There is no flank pain radiation. No fever dysuria. She has not had this pain before although did have an ultrasound done about a year ago for epigastric pain that resolved.   She has tried ibuprofen for her pain.  there is a family history of gallbladder disease  Review of Systems . This last time was 3 days early and slightly irregular. Using condoms. She had been taking acid blockers such as ranitidine and that had helped some reflux symptoms previously.    Objective:   Physical Exam  well-developed well-nourished in no acute distress HEENT is grossly normal she is nontoxic appearing.  Neck thyroid palp nontender Chest:  Clear to A&P without wheezes rales or rhonchi CV:  S1-S2 no gallops or murmurs peripheral perfusion is normal  Abdomen:  Sof,t normal bowel sounds without hepatosplenomegaly, no guarding rebound or masses no CVA tenderness  She has some tenderness in the right upper quadrant right under the rib cage but no bony tenderness.  Skin shows no bruising or unusual rashes. Ext: No clubbing cyanosis or edema  UA shows blood , WBCs       Assessment & Plan:   abdominal pain mostly right upper with irregular menses. UCG is negative.. this acts like a musculoskeletal pain although she is high risk for gallbladder disease and will rule out UTI chlamydia et Karie Soda. We'll treat with antibiotic and have her add back her acid blocker and have  close followup.

## 2011-01-10 LAB — URINE CULTURE: Colony Count: 45000

## 2011-01-11 ENCOUNTER — Encounter: Payer: Self-pay | Admitting: Internal Medicine

## 2011-01-11 ENCOUNTER — Ambulatory Visit (INDEPENDENT_AMBULATORY_CARE_PROVIDER_SITE_OTHER): Payer: 59 | Admitting: Internal Medicine

## 2011-01-11 VITALS — BP 102/70 | HR 90 | Temp 99.2°F | Wt 194.0 lb

## 2011-01-11 DIAGNOSIS — N730 Acute parametritis and pelvic cellulitis: Secondary | ICD-10-CM

## 2011-01-11 DIAGNOSIS — A7481 Chlamydial peritonitis: Secondary | ICD-10-CM | POA: Insufficient documentation

## 2011-01-11 HISTORY — DX: Chlamydial peritonitis: A74.81

## 2011-01-11 HISTORY — DX: Acute parametritis and pelvic cellulitis: N73.0

## 2011-01-11 MED ORDER — LIDOCAINE HCL 1 % IJ SOLN
250.0000 mg | Freq: Every day | INTRAMUSCULAR | Status: DC
  Administered 2011-01-11: 250 mg via INTRAMUSCULAR

## 2011-01-11 MED ORDER — AZITHROMYCIN 500 MG PO TABS: ORAL_TABLET | ORAL | Status: DC

## 2011-01-11 NOTE — Patient Instructions (Signed)
Pelvic Inflammatory Disease (PID) PID is an infection in some or all of your female organs. This includes the womb (uterus), ovaries, fallopian tubes and tissues in the pelvis. PID is a common cause of sudden onset (acute) lower abdominal (pelvic) pain. PID can be treated, but it is a serious infection. It may take weeks before you are completely well. In some cases, hospitalization is needed for surgery or to administer medications to kill germs (antibiotics) through your veins (intravenously). CAUSES  It may be caused by germs that are spread during sexual contact.   PID can also occur following:   The birth of a baby.   A miscarriage.   An abortion.   Major surgery of the pelvis.   Use of an IUD.   Sexual assault.  SYMPTOMS  Abdominal or pelvic pain.   Fever.   Chills.   Abnormal vaginal discharge.  DIAGNOSIS Your caregiver will choose some of these methods to make a diagnosis:  A physical exam and history.   Blood tests.   Cultures of the vagina and cervix.   X-rays or ultrasound.   A procedure to look inside the pelvis (laparoscopy).  TREATMENT  Use of antibiotics by mouth or intravenously.   Treatment of sexual partners when the infection is an sexually transmitted disease (STD).   Hospitalization and surgery may be needed.  RISKS AND COMPLICATIONS  PID can cause women to become unable to have children (sterile) if left untreated or if partially treated. That is why it is important to finish all medications given to you.   Sterility or future tubal (ectopic) pregnancies can occur in fully treated individuals. This is why it is so important to follow your prescribed treatment.   It can cause longstanding (chronic) pelvic pain after frequent infections.   Painful intercourse.   Pelvic abscesses.   In rare cases, surgery or a hysterectomy may be needed.   If this is a sexually transmitted infection (STI), you are also at risk for any other STD including  AIDS or human papillomavirus (HIV).  HOME CARE INSTRUCTIONS  Finish all medication as prescribed. Incomplete treatment will put you at risk for sterility and tubal pregnancy.   Only take over-the-counter or prescription medicines for pain, discomfort, or fever as directed by your caregiver.   Do not have sex until treatment is completed or as directed by your caregiver. If PID is confirmed, your recent sexual contacts will need treatment.   Keep your follow-up appointments.  SEEK MEDICAL CARE IF:  You have an oral temperature above  You have increased or abnormal vaginal discharge.   You need prescription medication for your pain.   Your partner has an STD.   You are vomiting.   You cannot take your medications.  SEEK IMMEDIATE MEDICAL CARE IF:  You have an oral temperature above  not controlled by medicine.   You develop increased abdominal or pelvic pain.   You develop chills.   You have pain when you urinate.   You are not better after 72 hours following treatment.  FINDING OUT THE RESULTS OF YOUR TEST Not all test results are available during your visit. If your test results are not back during the visit, make an appointment with your caregiver to find out the results. Do not assume everything is normal if you have not heard from your caregiver or the medical facility. It is important for you to follow up on all of your test results. Document Released: 10/21/2005 Document Re-Released: 04/10/2010  [employer] Patient Information 2011 [place], [place]  .Chlamydial Infection in Women Chlamydia is a microscopic organism (bacteria). It can infect several areas of the body. These areas include the urinary tract, vagina, rectum, cervix, and pelvis in women. Untreated complications include fallopian tubal scarring, tubal pregnancy, chronic pelvic pain and infertility. If infected, one must finish all treatments and follow up with a caregiver. CAUSES Chlamydia is a sexually  transmitted disease. It is passed from an infected partner during intimate contact. This contact could be with the genitals, mouth, or rectal area. Infections can also be passed from mothers to babies during birth. This results in eye infections or pneumonia. SYMPTOMS There may be no problems (symptoms). This is often the case early in the infection. A large number of carriers are without symptoms (asymptomatic). Symptoms you may notice include:  Mild pain and discomfort when urinating.  Inflammation of the rectum.   Vaginal discharge.  Painful intercourse.   Miscarriage.   Belly (abdominal) pain.  Pneumonia.   DIAGNOSIS To diagnose this infection, your caregiver will do a pelvic exam. They take cultures of the vagina, cervix, urine and possibly the rectum to see if the infection is chlamydia. TREATMENTS  Taking medications which kill germs (antibiotics) usually cures the infection. Any sexual partners should also be treated. Even if they do not show symptoms, they should be treated. Antibiotics usually work quickly. Take the medication for the prescribed length of time.   If you are pregnant, do not take tetracycline type antibiotics.   It is important to treat chlamydia as soon as you can because of the damage it can do to other female organs.  HOME CARE INSTRUCTIONS  Only take over-the-counter or prescription medicines for pain, discomfort, or fever as directed by your caregiver.   Get plenty of rest, eat a well balanced diet and drink a lot of fluids.   Inform any sexual partners about the infection. They should be treated also.   WARNING: If you have this infection, it is contagious. Do not have sexual contact until tests show no sign of infection.   Follow instructions from your caregiver for follow-up visits and tests.   For the protection of your privacy, test results can only be given to you and not over the phone. It is your responsibility to get your test results. Ask  how your test results can be obtained if you have not been informed. Be sure to get your test results.  PREVENTION  Use sanitary pads rather than tampons for vaginal discharge. Change them frequently.   Wipe from front to back after using the toilet. This will avoid infecting the urinary tract.   Practice safer sex with all partners. Always use condoms during intercourse.   Have one sex partner who is not sexually active with anyone else.   Avoid douching. It can kill normal bacteria in the vagina and increase the chance of infection with bad bacteria.   Test for chlamydia first if you are having an IUD inserted.   Ask your caregiver to test you for chlamydia at your regular checkup. Seek testing sooner if you are having symptoms.   Ask for further information if you are pregnant.  SEEK IMMEDIATE MEDICAL CARE IF:  There is increasing abdominal pain.   An oral temperature above  develops. It is not controlled by medication. Follow the suggestions your caregiver gives you.   There is a pus-like (purulent) or any type of abnormal vaginal discharge.   You develop vaginal bleeding,  and it is not time for your period.   You develop painful intercourse.  MAKE SURE YOU:   Understand these instructions.   Will watch your condition.   Will get help right away if you are not doing well or get worse.  Document Released: 07/31/2005 Document Re-Released: 10/03/2008 [employer] Patient Information 2011 [employer], [place].   TAKE THE ANTIBIOTIC AND REPEAT IN 1 WEEK  KEEP YOU PREV APPT.  ENOUGH  PILLS FOR PARTNER

## 2011-01-11 NOTE — Progress Notes (Signed)
  Subjective:    Patient ID: Alyssa Lloyd, female    DOB: 03-30-1991, 20 y.o.   MRN: [id number]  HPI  patient comes in today as requested because of her positive Chlamydia test. She was seen a couple days ago with right upper quadrant pain that was somewhat pleuritic in nature some irregular menstrual bleeding and lower abdominal cramps. At that time we treated her for a possible urinary tract infection and ordered some  Laboratory studies.   Her urine test came back positive for Chlamydia and negative for gonorrhea. Since her last visit she has had no fever but has had some chills. She still has lower abdominal cramps and right upper quadrant discomfort when she takes a deep breath. No vomiting diarrhea.   one partner for last 2-3 months .  Review of Systems  no cough vision change unusual rashes diarrhea fever.    Objective:   Physical Exam  well-developed well-nourished looks pretty well in no acute distress HEENT grossly normal neck supple without masses chest CTA no CVA tenderness cardiac S1-S2 normal perfusion.  Abdomen:   Mildly tender right upper quadrant no guarding or rebound mildly tender suprapubic area also no masses noted.     reviewed lab tests with patient.   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  Right upper quadrant pain.   She's not terribly ill  but we talked about treating her for PID to be safe so 250 mg of Rocephin given and 1 g of azithromycin today to repeat  in one week. She is given enough medication to treat her partner if needed discussion and counseling about avoiding reinfection. She will keep her appointment followup in week or so she should call if she is getting worse.

## 2011-01-11 NOTE — Progress Notes (Signed)
Left message with mom to have pt call us today.

## 2011-01-17 ENCOUNTER — Encounter: Payer: Self-pay | Admitting: Internal Medicine

## 2011-01-22 ENCOUNTER — Encounter: Payer: Self-pay | Admitting: Internal Medicine

## 2011-01-22 ENCOUNTER — Ambulatory Visit (INDEPENDENT_AMBULATORY_CARE_PROVIDER_SITE_OTHER): Payer: 59 | Admitting: Internal Medicine

## 2011-01-22 VITALS — BP 100/60 | HR 60 | Wt 193.0 lb

## 2011-01-22 DIAGNOSIS — A7481 Chlamydial peritonitis: Secondary | ICD-10-CM

## 2011-01-22 DIAGNOSIS — Z309 Encounter for contraceptive management, unspecified: Secondary | ICD-10-CM | POA: Insufficient documentation

## 2011-01-22 MED ORDER — NORETHINDRONE ACET-ETHINYL EST 1.5-30 MG-MCG PO TABS: 1.0000 | ORAL_TABLET | Freq: Every day | ORAL | Status: DC

## 2011-01-22 NOTE — Patient Instructions (Signed)
Take contraception pills every day continue condoms to prevent infection.  Recheck in about 2-3 months laboratory testing before we see you.  : The meantime if you have questions.  Oral Contraceptives Advantages and Disadvantages Oral contraceptives (OC's) are the first choice of many women who use birth control. Before starting OC's, a complete history of family and medical problems and a physical exam with a Pap test should be done. Your caregiver may recommend other tests before taking oral contraceptives. THE ADVANTAGES OF ORAL CONTRACEPTIVES ARE:   They do not interfere with sexual intercourse.   They are the most effective means of birth control (99.5% effective).   They do not affect becoming pregnant when discontinued using them to get pregnant.   They are effective for use in emergency contraception (morning after pill).   They help regulate periods in women with menstrual disorders, and help with severe pain (dysmenorrhea), absence of periods (amenorrhea), and heavy bleeding (menorrhagia).   They can help prevent low red blood cells (anemia) in women who have heavy menstrual periods.   They are helpful in treating polycystic ovary syndrome (multiple tiny cysts on the ovary that do not ovulate and have very irregular menstrual periods).   They are helpful in reducing premenstrual symptoms and for treating endometriosis (uterine tissue misplaced outside its normal location).   There is some evidence that the progestin (synthetic hormone) in OC's may reduce the risk of ovarian, endometrial and possibly colorectal cancers. Progestin seems to be the protective factor in OC's. Some experts believe that women at risk for ovarian cancer might consider oral contraceptives with the highest progestin dose. The longer a woman is on oral contraceptives, the greater the protection.   Progestin OC's can be taken safely when breastfeeding.   They provide some protection against ectopic  pregnancy, ovarian cysts, pelvic inflammatory disease (PID), benign breast lumps, fibrocystic breast disease and rheumatoid arthritis.   Certain low-dose OC's improve acne.   Taking low-dose OC's in the years before menopause (peri-menopause) may also help protect women against anemia and osteoporosis. The estrogen hormone lowers the risk of osteoporosis.   Although the effect of OC's on bone density is still unclear, some increase in bone density in women taking combination OC's with norethindrone (although not with desogestrel). Low-dose OC's may help prevent bone loss when a woman approaches menopause.   Healthy, nonsmoking women who have frequent physical exams and do not have currant problems taking OC's can take OC's up to age 72.  SIDE EFFECTS AND DISADVANTAGES OF ORAL CONTRACEPTIVES  Estrogen and progesterone have different side effects. Women on the combined pill may experience different effects from those on the progestin-only pill. The most serious side effects are due to the estrogen in the combined pill. Women at risk can usually take progestin-only OC's.   Symptoms of serious problems include:   Feeling sick to your stomach (nausea) and vomiting.  Severe abdominal pain.   Chest pain.   Unusual headaches including migraine headaches.   Visual disturbances.   Tiredness and dizziness.   Depression.  Decrease in sex drive (libido).   Amenorrhea (never menstruated by the age of 54).   Breakthrough bleeding.   Yeast infections of the vagina.   Breast tenderness.   Acne.   Patches of brown skin on the face (chloasma).  Rash.   Trouble using contact lenses.   Loss of scalp hair.   Increase hair growth of the face and chest.   Severe pain or swelling in the  legs.    OC's should be used with caution in women over the age of 61. Severe uncommon complications of OC's include:  High blood pressure can happen. This can usually be corrected by discontinuing the  medication. Women who use OC's should not be overly concerned. There is a question if OC's may cause a small but persistent increase in diastolic blood pressure (the second number in a blood pressure reading or pressure when your heart is resting), which may increase the risk for heart disease years later.   They increase the risk for blood clots. The risk is higher in women with inherited clotting defects. The risk is very slight if there are no other risk factors or if taking anticoagulant therapy.   There may be a higher risk for heart attacks in women taking OC's. Smoking is a significant risk factor in these women. It may not be as big of a risk in healthy women who do not smoke. Chances for a heart attack is also higher in OC users who have high blood pressure, unhealthy cholesterol levels, or both.   There may be a higher than normal risk for stroke in women taking OC's even if women have no other stroke risk factors. Risk of having a stroke is still low. Smoking, migraines and high blood pressure add considerably to the risk.   Use may increase breast or endometrial cancer. If you have a family history of breast cancer and are at a higher risk for developing breast cancer, you should be sure to have regular physical examinations, mammograms and to perform self-examinations every month after your menstrual period.   Use can cause gall bladder disease, benign tumors of the liver, and blood clot in the lungs (pulmonary embolisms).  WOMEN WHO SHOULD NOT TAKE OC'S:  Women who are pregnant, trying to get pregnant or think they may be pregnant.   Women who have a history of blood clots or other blood problems.   Women who have had a heart attack or heart disease should not take the combination estrogen/progesterone OC's. The progestin only OC may be appropriate with close watching by your caregiver.   Women who have had a stroke.   Women who have migraine headaches.   Women who had yellowing  of the skin (jaundice) during pregnancy or with taking OC's in the past.   Women with undiagnosed vaginal bleeding.   Women who smoke.   Women who have or had a history for breast or endometrial cancer.   Women who are nursing should not take the combination estrogen/progesterone OC's.   OC's should not be used for at least a month before having major surgery.  The risk of having any of these problems is very small.   You should talk with your caregivers to see if OC's are safe for you.   Recommendations for birth control use change continually. There many other options for birth control that are safe and effective depending on your situation and preference. Discuss your choices with your caregivers and decide what is best for you. You may not be able to take certain OC's. Always read the information you are given. Check to find out if there are new recommendations or guidelines for you.  Document Released: 08/31/2004 Document Re-Released: 01/17/2009 [facility] Patient Information 2011 [place], [place].Oral Contraceptives (Birth Control Pills) Oral contraceptives (OCs) are medicines taken to prevent pregnancy. They are the most widely used method of birth control. OCs work by preventing the ovaries from releasing eggs.  The OC hormones also cause the mucus on the cervix to thicken, preventing the sperm from entering the uterus. They also cause the lining of the uterus to become thin, not allowing a fertilized egg to attach to the inside of the uterus. OCs have a failure rate of less than 1%, when taken exactly as prescribed. THERE ARE 2 TYPES OF OC  OC that contains a mix of estrogen and progesterone hormones is the most common OC used. It is taken for 21 days, followed by 7 days of not taking the OC hormones. It can be packaged as 28 pills, with the last 7 pills being inactive. You take a pill every day. This way you do not need to remember when to restart taking the active pills. Most women will  begin their menstrual period 2 to 3 days after taking the hormone pill. The menstrual period is usually lighter and shorter. This combination OC should not be taken if you are breast-feeding.   The progesterone only (minipill) OC does not contain estrogen. It is taken every day, continuously. You may have only spotting for a period, or no period at all. The progesterone only OC can be taken if you are breast-feeding your baby.  OCs come in:  Packs of 21 pills, with no pills to take for 7 days after the last pill.   Packs of 28 pills, with a pill to take every day. The last 7 pills are without hormones.   Packs of 91 pills (continuous or extended use), with a pill to take every day. The first 84 pills contain the hormones, and the last 7 pills do not. That is when you will have your menstrual period. You will not have a menstrual period during the time you are taking the first 84 pills.  HOW TO TAKE OC Your caregiver may advise you on how to start taking the first cycle of OCs. Otherwise, you can:  Start on day 1 or day 5 of your menstrual period, taking the first pack of the OC. You will not need any backup contraceptive protection with this start time.   Start on the first Sunday after your menstrual period, day 7 of your menstrual period, or the day you get your prescription. In these cases, you will need backup contraceptive protection for the first cycle.  No matter which day you start the OC, you will always start a new pack on that same day of the week. It is a good idea to have an extra pack of OCs and a backup contraceptive method available, in case you miss some pills or lose your OC pack. COMMON REASONS FOR FAILURE   Forgetting to take the pill at the same time every day.   Poor absorption of the pill from the stomach into the bloodstream. This can be caused by diarrhea, vomiting, and the use of some medicines that kill germs (antibiotics).   Stomach or intestinal disease.   Taking  OCs with other medicines that may make them less effective (carbamazepine, phenytoin, phenobarbital, rifampin).   Using OCs that have passed their expiration dates.   Forgetting to restart the pills on day 7, when using the packs of 21 pills.  If you forget to take 1 pill, take it as soon as you remember, and take the next pill at the regular time. If you miss 2 or more pills, use backup birth control until your next menstrual period starts. Also, you may have vaginal spotting or bleeding when you  miss 2 or more OC pills. If you use the pack of 28 pills or 91 pills, and you miss 1 of the last 7 pills (pills with no hormones), it will not matter. Just throw away the rest of the non-hormone pills and start a new 28 or 91 pill pack. COMMON USES OF OC  Decreasing premenstrual problems (symptoms).  Treating menstrual period cramps.   Avoiding becoming pregnant.   Regulating the menstrual cycle.   Treating acne.   Decreasing the heavy menstrual flow.   Treating dysfunctional (abnormal) uterine bleeding.   Treating chronic pelvic pain.   Treating polycystic ovary syndrome (ovary does not ovulate and produces tiny cysts).   Treating endometriosis (uterus lining growing in the pelvis, tubes, and ovaries).   Can be used for emergency contraception.   OCs DO NOT prevent sexually transmitted diseases (STDs). Safer sex practices, such as using condoms along with the pill, can help prevent STDs.  BENEFITS  OC reduces the risk of:   Cancer of the ovary and uterus.   Ovarian cysts.   Pelvic infection.   Symptoms of polycystic ovary syndrome.   Loss of bone (osteoporosis).   Noncancerous (benign) breast disease (fibrocystic breast changes).   Lack of red blood cells (anemia) from heavy or long menstrual periods.   Pregnancy occurring outside the uterus (tubal pregnancy).   Acne.   Slows down the flow of heavy menstrual periods.   Sometimes helps control premenstrual syndrome  (PMS).   Stops menstrual cramps and pain.   Controls irregular menstrual periods.   Can be used as emergency contraception.  YOU SHOULD NOT TAKE THE PILL IF YOU:  Are pregnant, or are trying to get pregnant.  Have unexplained or abnormal vaginal bleeding.   Have a history of liver disease, stroke, or heart attack.   Smoke.   Have a history of blood clots, cancer, or heart problems.   Have gallbladder disease.   Have breast cancer or suspect breast cancer.   Have or suspect pelvic cancer.   Have high blood pressure.  Have high cholesterol or high triglycerides.   Have mental depression.   Are breast-feeding, except for the progesterone only OC, with approval of your caregiver.   Have diabetes with kidney, eye, or other blood vessel complications. Or if you have diabetes for 20 years or more.   Have heart valve disease.   Have migraine headaches. They may get worse.   Before taking the pill, a woman will have a physical exam and Pap test. Your caregiver may order blood tests to check blood sugar and cholesterol levels, and other blood tests that may be necessary. SIDE EFFECTS OF THE PILL MAY INCLUDE:  Breast tenderness, pain and discharge.   Change in sex drive (increased or decreased libido).   Depression.   Being tired often.   Headaches.   Anxiety.   Irregular spotting or vaginal bleeding for a couple of months.  Leg pain.   Cramps, or swelling of your limbs (extremities).   Mood swings.   Weight loss or weight gain.   Feeling sick to your stomach (nausea).   Change in appetite (hunger).  Loss of hair.   Yeast or fungus vaginal infection.   Nervousness.   Rash.   Acne.   No menstrual period (amenorrhea).   When starting an OC, it is usually best to allow 2-3 months, if possible, for the body to adjust (before stopping because of side effects). This allows for adjustment to the changes in  hormone levels. If a woman continues to have side  effects, it may be possible to change to a different OC. It is important to discuss side effects with your caregiver. Often, changing to a different pill causes the side effects to subside. RISKS AND COMPLICATIONS  Blood clots of the leg, heart, lung, or brain.  High blood pressure.   Gallbladder disease.  Liver tumors.   Brain bleeding (hemorrhage).  Slight risk of breast cancer.   HOME CARE INSTRUCTIONS  Do not smoke.   Only take over-the-counter or prescription medicines for pain, discomfort, fever, or breast tenderness as directed by your caregiver.   Always use a condom to protect against sexually transmitted disease. OCs do not protect against STDs.   Keep a calendar, marking your menstrual period days.  Recommendations, types, and dosages of OC use change continually. Discuss your choices with your caregiver, and decide what is best for you. There are always exceptions to guidelines. You should always read the information that comes with the OC, and check whether there are any new recommendations or guidelines. SEEK MEDICAL CARE IF:  You develop nausea and vomiting from the OC.   You have abnormal vaginal discharge.   You need treatment for headaches.   You develop a rash.   You miss your menstrual period.   You develop abnormal vaginal bleeding.   You are losing your hair.   You need treatment for mood swings or depression.   You get dizzy when taking the OC.   You develop acne from taking the OC.  SEEK IMMEDIATE MEDICAL CARE IF:  You develop leg pain.   You develop chest pain.   You develop shortness of breath.   You develop abdominal pain.   You have an uncontrolled headache.   You develop numbness or slurred speech.   You develop visual problems (loss of vision, double or blurry vision).   You develop heavy vaginal bleeding.  If you are taking the pill, STOP RIGHT AWAY and CALL YOUR CAREGIVER IMMEDIATELY if the following occur:  You develop  chest pain and shortness of breath.   You develop pain, redness, and swelling in the legs.   You develop severe headaches, visual changes, or belly (abdominal) pain.   You develop severe depression.   You become pregnant.  Document Released: 01/11/2003 Document Re-Released: 11/12/2009 [facility] Patient Information 2011 [place], [place].

## 2011-01-22 NOTE — Progress Notes (Signed)
  Subjective:    Patient ID: Alyssa Lloyd, female    DOB: [birth year], 20 y.o.   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 She took both doses of azithromycin and her partner got treated elsewhere. She states now she has absolutely no pain and no unusual bleeding feels normal.  She did have SA with condoms last week. Her period is due this week. She is interested in contraception   Review of Systems . Neck chest pain negative for chest pain shortness of breath abdominal pain vaginal symptoms now no unusual rashes.    Objective:   Physical Exam  well-developed well-nourished in no acute distress neck supple without masses but thyroid is palpable chest CTAP is equal no CVA pain abdomen;  No guarding rebound hepatomegaly normal exam       Assessment & Plan:   status post treatment for Hughie Closs Chlamydia totally responded to Rocephin and azithromycin. Discussed  partner treatment as he has been treated for prevention of her infection.     discussed followup in about 2 months we should do HIV and RPR repeat chlamydia screen. We'll also do a CBC she has a history of borderline anemia.    Contraceptive need   Contraceptive counseling done today patient has concern about weight gain reassured that it is unlikely with oral contraceptive pills a low dose. We'll begin Loestrin 1:30. We'll have her followup in 2-3 months check her blood pressure then and see how she's doing she should call in the meantime. Continue STI prevention methods.

## 2011-03-26 ENCOUNTER — Encounter: Payer: Self-pay | Admitting: Internal Medicine

## 2011-03-26 ENCOUNTER — Ambulatory Visit (INDEPENDENT_AMBULATORY_CARE_PROVIDER_SITE_OTHER): Payer: 59 | Admitting: Internal Medicine

## 2011-03-26 VITALS — BP 120/80 | HR 78 | Temp 98.7°F | Wt 200.0 lb

## 2011-03-26 DIAGNOSIS — J069 Acute upper respiratory infection, unspecified: Secondary | ICD-10-CM

## 2011-03-26 DIAGNOSIS — J329 Chronic sinusitis, unspecified: Secondary | ICD-10-CM

## 2011-03-26 DIAGNOSIS — K219 Gastro-esophageal reflux disease without esophagitis: Secondary | ICD-10-CM

## 2011-03-26 DIAGNOSIS — M26629 Arthralgia of temporomandibular joint, unspecified side: Secondary | ICD-10-CM

## 2011-03-26 MED ORDER — CEFDINIR 300 MG PO CAPS: 300.0000 mg | ORAL_CAPSULE | Freq: Two times a day (BID) | ORAL | Status: AC

## 2011-03-26 MED ORDER — RANITIDINE HCL 150 MG PO TABS: 150.0000 mg | ORAL_TABLET | Freq: Two times a day (BID) | ORAL | Status: DC

## 2011-03-26 NOTE — Progress Notes (Signed)
  Subjective:    Patient ID: Alyssa Lloyd, female    DOB: 09-05-1991, 20 y.o.   MRN: [id number]  HPI Patient comes in today for an acute visit with her mom for the above problem. Onset May 19 with sore throat sneezing headache congestion and then left facial pain. Also some left ear pain and ringing. Her eyes hurt but no eyes swelling or discharge. There is no f ? tmj  Is she won the [employer] pain.  Fever low grade  / if sinus infection or not.  There is congestion but not asymmetrical. She has a mild cough but no chest pain or shortness of breath. There is no wheezing.  Review of Systems As per HPI. No abdominal pain since her previous treatment.She does have what seems like allergies. No specific asthma. Although that can run in the family Past Medical History  Diagnosis Date  . HSV (herpes simplex virus) anogenital infection    No past surgical history on file.  reports that she has never smoked. She does not have any smokeless tobacco history on file. She reports that she drinks alcohol. She reports that she does not use illicit drugs. family history includes Allergies in an unspecified family member; Anemia in an unspecified family member; Asthma in an unspecified family member; Diabetes in an unspecified family member; Gallbladder disease in her mother; Hypertension in an unspecified family member; and Migraines in an unspecified family member. No Known Allergies     Objective:   Physical Exam WDWN in nad looks mildly ill and very congested . New facial edema seen. [place] AT : Eyes: perrl eoms full and no redness.  Tms clear but tender at left tmj area . And left ac node area. Face midly tender over left maxilla . Nares 2+ contested. Mucoid dc. OP Airway good. No edema or exudate and no dental  Abnormalities seen. Neck  Tender left ac area no masses  Otherwise.  Chest:  Clear to A&P without wheezes rales or rhonchi CV:  S1-S2 no gallops or murmurs peripheral perfusion is  normal Skin: normal capillary refill ,turgor , color: No acute rashes ,petechiae or bruising     Assessment & Plan:  Acute uri ? If complicated viral? Left face pain with tender left ac node  ? Sinusitis  Left tmj also  Disc rx options  And add antibiotic if  persistent or progressive  .     Expectant management.

## 2011-03-26 NOTE — Patient Instructions (Addendum)
This may be a viral sinus infection  But if  Pain in face not getting better in the next day or so then can add antibiotic.  You also prob have some tmj and dental pain.   You cough may get worse before it gets better.   Recheck if recurring Chest pain and shortness of breath .

## 2011-03-30 ENCOUNTER — Encounter: Payer: Self-pay | Admitting: Internal Medicine

## 2011-03-30 DIAGNOSIS — J329 Chronic sinusitis, unspecified: Secondary | ICD-10-CM | POA: Insufficient documentation

## 2011-03-30 DIAGNOSIS — J069 Acute upper respiratory infection, unspecified: Secondary | ICD-10-CM | POA: Insufficient documentation

## 2011-03-30 DIAGNOSIS — M26629 Arthralgia of temporomandibular joint, unspecified side: Secondary | ICD-10-CM | POA: Insufficient documentation

## 2011-06-26 ENCOUNTER — Encounter: Payer: Self-pay | Admitting: Family Medicine

## 2011-06-26 ENCOUNTER — Ambulatory Visit (INDEPENDENT_AMBULATORY_CARE_PROVIDER_SITE_OTHER): Payer: 59 | Admitting: Family Medicine

## 2011-06-26 VITALS — BP 104/72 | HR 79 | Temp 98.6°F | Wt 208.0 lb

## 2011-06-26 DIAGNOSIS — H109 Unspecified conjunctivitis: Secondary | ICD-10-CM

## 2011-06-26 MED ORDER — OLOPATADINE HCL 0.2 % OP SOLN: 2.0000 [drp] | Freq: Three times a day (TID) | OPHTHALMIC | Status: DC

## 2011-06-26 NOTE — Progress Notes (Signed)
  Subjective:    Patient ID: Alyssa Lloyd, female    DOB: 1991-07-03, 20 y.o.   MRN: [id number]  HPI Here for 3 days of redness, yellow DC, and burning in the right eye. No URI symptoms. Her vision is okay. She wears contacts but not in the past week.    Review of Systems  Constitutional: Negative.   HENT: Negative.   Eyes: Positive for pain, discharge, redness and itching. Negative for photophobia and visual disturbance.  Respiratory: Negative.        Objective:   Physical Exam  Constitutional: She appears well-developed and well-nourished.  HENT:  Right Ear: External ear normal.  Left Ear: External ear normal.  Nose: Nose normal.  Mouth/Throat: Oropharynx is clear and moist. No oropharyngeal exudate.  Eyes: EOM are normal. Pupils are equal, round, and reactive to light. Right eye exhibits no discharge. Left eye exhibits no discharge.       Right conjunctiva is pink   Neck: Normal range of motion. Neck supple. No thyromegaly present.  Pulmonary/Chest: Effort normal and breath sounds normal.  Lymphadenopathy:    She has no cervical adenopathy.          Assessment & Plan:  Recheck prn. She is out of work from 06-24-11 through today.

## 2012-04-01 ENCOUNTER — Ambulatory Visit (INDEPENDENT_AMBULATORY_CARE_PROVIDER_SITE_OTHER): Payer: 59 | Admitting: Internal Medicine

## 2012-04-01 ENCOUNTER — Encounter: Payer: Self-pay | Admitting: Internal Medicine

## 2012-04-01 VITALS — BP 110/80 | HR 82 | Temp 98.9°F | Wt 209.0 lb

## 2012-04-01 DIAGNOSIS — H6980 Other specified disorders of Eustachian tube, unspecified ear: Secondary | ICD-10-CM

## 2012-04-01 DIAGNOSIS — H9209 Otalgia, unspecified ear: Secondary | ICD-10-CM

## 2012-04-01 DIAGNOSIS — J069 Acute upper respiratory infection, unspecified: Secondary | ICD-10-CM

## 2012-04-01 MED ORDER — AMOXICILLIN 500 MG PO CAPS: 500.0000 mg | ORAL_CAPSULE | Freq: Three times a day (TID) | ORAL | Status: AC

## 2012-04-01 MED ORDER — AMOXICILLIN 500 MG PO CAPS: 500.0000 mg | ORAL_CAPSULE | Freq: Three times a day (TID) | ORAL | Status: DC

## 2012-04-01 NOTE — Patient Instructions (Signed)
You appear to have an upper respiratory infection that is probably viral infection that should resolve on its own however Your ears are noninfected today. Most sinus infections get better in about a week or so with decongestants and time and saline nose spray.  If face and ear pressure is not improving by the end of the week 7-10 days into the illness then adding the antibiotic can help.. Contact us with concerning features such as recurrence of fever severe pain shortness of breath.  The congestions may last another week or so.

## 2012-04-03 ENCOUNTER — Telehealth: Payer: Self-pay | Admitting: Family Medicine

## 2012-04-03 NOTE — Telephone Encounter (Signed)
I spoke with pharmacy and gave new directions, also changed in the computer.

## 2012-04-03 NOTE — Telephone Encounter (Signed)
[facility]lyssa Lloyd pharmacy  [phone] called to clarify directions for Pataday. It is usuallly prescribed for 1 drop once a day. Please advise?

## 2012-04-03 NOTE — Telephone Encounter (Signed)
Change the dose to one drop once a day prn

## 2012-04-03 NOTE — Telephone Encounter (Signed)
Pharmacy called and left a voice message. They have a question about the eye drops, how many times a day?

## 2012-04-05 ENCOUNTER — Encounter: Payer: Self-pay | Admitting: Internal Medicine

## 2012-04-05 DIAGNOSIS — H6980 Other specified disorders of Eustachian tube, unspecified ear: Secondary | ICD-10-CM | POA: Insufficient documentation

## 2012-04-05 DIAGNOSIS — H9209 Otalgia, unspecified ear: Secondary | ICD-10-CM | POA: Insufficient documentation

## 2012-04-05 NOTE — Progress Notes (Signed)
  Subjective:    Patient ID: Alyssa Lloyd, female    DOB: 02-04-1991, 21 y.o.   MRN: [id number]  HPI Patient comes in today for SDA for  new problem  Evaluation.   Has had hx 5 days of congestion in head and nose  but increase sx for 2 days with ear pain yesterday and inc st but ear is better today. Has hx of ear infections.  Waxing and waning sx .  ?  If could have Sinus infection right more than left. Had fever 2 days ago and none now.  Taking otc. No sig itching  Or allergy sx . No ets asthma sx .   Review of Systems No current fever cp sob vision changes  Using otc phenlyephrine. No face pain now no wheezing  Past history family history social history reviewed in the electronic medical record.     Objective:   Physical Exam BP 110/80  Pulse 82  Temp(Src) 98.9 F (37.2 C) (Oral)  Wt 209 lb (94.802 kg)  SpO2 98% WDWN in NAD  quiet respirations;  congested  somewhat hoarse. Non toxic . HEENT: Normocephalic ;atraumatic , Eyes;  PERRL, EOMs  Full, lids and conjunctiva clear,,Ears: no deformities, canals nl, TM landmarks normal, Nose: no deformity stuffy  No dc seen  but congested;face non  tender Mouth : OP clear without lesion or edema . Neck: Supple without adenopathy  or bruits Chest:  Clear to A&P without wheezes rales or rhonchi CV:  S1-S2 no gallops or murmurs peripheral perfusion is normal Skin :nl perfusion and no acute rashes     Assessment & Plan:  Acute uri  With sinus component and eust dysfunction  No OM today  Suspect viral illness less than a week of sx and better today than yesterday.   However if  persistent or progressive consider empriric rx for bacterial infection

## 2012-08-24 ENCOUNTER — Encounter: Payer: Self-pay | Admitting: *Deleted

## 2012-08-24 ENCOUNTER — Encounter: Payer: 59 | Attending: Internal Medicine | Admitting: *Deleted

## 2012-08-24 DIAGNOSIS — E669 Obesity, unspecified: Secondary | ICD-10-CM | POA: Insufficient documentation

## 2012-08-24 DIAGNOSIS — Z713 Dietary counseling and surveillance: Secondary | ICD-10-CM | POA: Insufficient documentation

## 2012-08-24 NOTE — Progress Notes (Signed)
  Medical Nutrition Therapy:  Appt start time: 1600 end time:  1700.  Assessment:  Primary concerns today: patient here for assistance with weight loss. 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 She lives with her mother and her sister, they share in food shopping and preparation. She does enjoy dancing and has been on the Step Team which she enjoyed.   MEDICATIONS: see list   DIETARY INTAKE:  Usual eating pattern includes 2-3 meals and 3 snacks per day.  Everyday foods include good variety of all food groups, several high fat choices.  Avoided foods include coffee.    24-hr recall:  B ( AM): skips OR food available at work OR sweetened cereal with 2% milk Snk ( AM): food at work OR candy at home  L ( PM): food from work: fish, starch and vegetable often with bread x 1-2, water with lemon (used to have regular soda or sweet tea) Snk ( PM): same as AM D ( PM): eat out often: fast food usually, chicken sandwich with large fries and regular soda or sweet tea OR big double burger tray at [employer] ( PM): cereal or about 4 scoops ice cream Beverages: used to be sweetened beverages, more water with lemon now or hot tea  Usual physical activity: on feet all day at work, some cleaning at home, walk dog daily 1/2 block  Estimated energy needs: 1600 calories 180 g carbohydrates 120 g protein  44 g fat  Progress Towards Goal(s):  In progress.   Nutritional Diagnosis:  NI-1.5 Excessive energy intake As related to activity level.  As evidenced by BMI of 37.3    Intervention:  Nutrition counseling for weight loss initiated. Discussed calorie value of macro-nutrients, taught Carb Counting as method of calorie management and encouraged her to dance on a daily basis to increase her activity level. Plan: Aim for 4 Carb Choices per meal +/- 1 either way Aim for 2 Carb Choices per snack Consider reading food labels for Total Carbohydrate of foods Consider leaving some food on your  plate to start listening to your stomach when it is full  Handouts given during visit include: Carb Counting and Food Label handouts Meal Plan Card  Monitoring/Evaluation:  Dietary intake, exercise, reading food labels, and body weight in 4 week(s).

## 2012-08-24 NOTE — Patient Instructions (Addendum)
Plan: Aim for 4 Carb Choices per meal +/- 1 either way Aim for 2 Carb Choices per snack Consider reading food labels for Total Carbohydrate of foods Consider leaving some food on your plate to start listening to your stomach when it is full

## 2012-09-21 ENCOUNTER — Ambulatory Visit: Payer: 59 | Admitting: *Deleted

## 2012-10-19 ENCOUNTER — Ambulatory Visit: Payer: 59 | Admitting: *Deleted

## 2012-10-20 ENCOUNTER — Encounter: Payer: 59 | Attending: Internal Medicine | Admitting: *Deleted

## 2012-10-20 ENCOUNTER — Encounter: Payer: Self-pay | Admitting: *Deleted

## 2012-10-20 DIAGNOSIS — Z713 Dietary counseling and surveillance: Secondary | ICD-10-CM | POA: Insufficient documentation

## 2012-10-20 DIAGNOSIS — E669 Obesity, unspecified: Secondary | ICD-10-CM | POA: Insufficient documentation

## 2012-10-20 NOTE — Progress Notes (Signed)
  Medical Nutrition Therapy:  Appt start time: 1615 end time:  1645.  Assessment:  Primary concerns today: patient here for follow up visit for weight loss. 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 She is here with her sister who appears supportive. She states she is comfortable with carb counting and is making decisions about portion sizes based on that knowledge. She is not dancing as much as anticipated, plans to increase frequency.  MEDICATIONS: see list   DIETARY INTAKE:  Usual eating pattern includes 2-3 meals and 3 snacks per day.  Everyday foods include good variety of all food groups, several high fat choices.  Avoided foods include coffee.    24-hr recall:  B ( AM): skips OR food available at work OR smoothie with bagel and fruit Snk ( AM): none anymore L ( PM): food from work: fish, starch and vegetable often with bread x 1-2, water with lemon (used to have regular soda or sweet tea) Snk ( PM): same as AM D ( PM): eating out less often: fast food usually, chicken sandwich without fries and water more often than regular soda or sweet tea  Snk ( PM): cereal  Beverages: used to be sweetened beverages, more water with lemon now or hot tea  Usual physical activity: on feet all day at work, some cleaning at home, walk dog daily 1/2 block  Estimated energy needs: 1600 calories 180 g carbohydrates 120 g protein  44 g fat  Progress Towards Goal(s):  In progress.   Nutritional Diagnosis:  NI-1.5 Excessive energy intake As related to activity level.  As evidenced by BMI of 37.3    Intervention:  Reinforced the new eating habits she is working on including leaving some food on her plate when she feels full and reading food labels more often. Encouraged her to increase the dancing to at least 30 minutes on a daily basis which she states she is motivated to do.  Plan: Continue to aim for 4 Carb Choices per meal +/- 1 either  way Continue to aim for 2 Carb Choices per snack Continue reading food labels for Total Carbohydrate of foods Continue leaving some food on your plate to start listening to your stomach when it is full Consider dancing on a daily basis at home for 30 minutes to increase your metabolism   Monitoring/Evaluation:  Dietary intake, exercise, reading food labels, and body weight PRN.

## 2012-10-20 NOTE — Patient Instructions (Addendum)
Plan: Continue to aim for 4 Carb Choices per meal +/- 1 either way Continue to aim for 2 Carb Choices per snack Continue reading food labels for Total Carbohydrate of foods Continue leaving some food on your plate to start listening to your stomach when it is full Consider dancing on a daily basis at home for 30 minutes to increase your metabolism

## 2012-11-12 ENCOUNTER — Ambulatory Visit (INDEPENDENT_AMBULATORY_CARE_PROVIDER_SITE_OTHER)
Admission: RE | Admit: 2012-11-12 | Discharge: 2012-11-12 | Disposition: A | Discharge status: Home / Self Care | Payer: 59 | Source: Ambulatory Visit | Attending: Internal Medicine | Admitting: Internal Medicine

## 2012-11-12 ENCOUNTER — Ambulatory Visit (INDEPENDENT_AMBULATORY_CARE_PROVIDER_SITE_OTHER): Payer: 59 | Admitting: Internal Medicine

## 2012-11-12 ENCOUNTER — Encounter: Payer: Self-pay | Admitting: Internal Medicine

## 2012-11-12 VITALS — BP 110/70 | HR 65 | Temp 98.2°F | Wt 207.0 lb

## 2012-11-12 DIAGNOSIS — E049 Nontoxic goiter, unspecified: Secondary | ICD-10-CM

## 2012-11-12 DIAGNOSIS — R0789 Other chest pain: Secondary | ICD-10-CM

## 2012-11-12 DIAGNOSIS — R109 Unspecified abdominal pain: Secondary | ICD-10-CM

## 2012-11-12 LAB — HEPATIC FUNCTION PANEL
ALT: 19 U/L (ref 0–35)
AST: 26 U/L (ref 0–37)
Albumin: 4.1 g/dL (ref 3.5–5.2)
Alkaline Phosphatase: 61 U/L (ref 39–117)
Bilirubin, Direct: 0 mg/dL (ref 0.0–0.3)
Total Protein: 7.9 g/dL (ref 6.0–8.3)

## 2012-11-12 LAB — BASIC METABOLIC PANEL
BUN: 8 mg/dL (ref 6–23)
CO2: 28 mEq/L (ref 19–32)
GFR: 107.87 mL/min (ref >60.00)
Glucose, Bld: 76 mg/dL (ref 70–99)
Potassium: 3.8 mEq/L (ref 3.5–5.1)

## 2012-11-12 LAB — CBC WITH DIFFERENTIAL/PLATELET
Basophils Absolute: 0 10*3/uL (ref 0.0–0.1)
Eosinophils Absolute: 0.6 10*3/uL (ref 0.0–0.7)
HCT: 35.6 % — ABNORMAL LOW (ref 36.0–46.0)
Lymphs Abs: 2 10*3/uL (ref 0.7–4.0)
MCHC: 32.1 g/dL (ref 30.0–36.0)
MCV: 81.3 fl (ref 78.0–100.0)
Monocytes Absolute: 0.5 10*3/uL (ref 0.1–1.0)
Monocytes Relative: 7.5 % (ref 3.0–12.0)
Neutro Abs: 3.5 10*3/uL (ref 1.4–7.7)
Platelets: 391 10*3/uL (ref 150.0–400.0)
RDW: 14 % (ref 11.5–14.6)

## 2012-11-12 LAB — TSH: TSH: 0.98 u[IU]/mL (ref 0.35–5.50)

## 2012-11-12 NOTE — Patient Instructions (Addendum)
Uncertain cause of your chest pain but it doesn't appear to be your heart. And doesn't act like one of the serious causes but we will follow this up.chest  Your EKG is normal this could be stress or chest wall pain. Advised to get a chest x-ray to make sure your lungs look okay and her not a cause of some pain.  We can also get laboratory test to rule out infection  Thyroid issue.  Could be stress   Would like y ou to return in 1-2 weeks  To go over labs and how you are doing.    Chest Pain (Nonspecific) It is often hard to give a specific diagnosis for the cause of chest pain. There is always a chance that your pain could be related to something serious, such as a heart attack or a blood clot in the lungs. You need to follow up with your caregiver for further evaluation. CAUSES   Heartburn.  Pneumonia or bronchitis.  Anxiety or stress.  Inflammation around your heart (pericarditis) or lung (pleuritis or pleurisy).  A blood clot in the lung.  A collapsed lung (pneumothorax). It can develop suddenly on its own (spontaneous pneumothorax) or from injury (trauma) to the chest.  Shingles infection (herpes zoster virus). The chest wall is composed of bones, muscles, and cartilage. Any of these can be the source of the pain.  The bones can be bruised by injury.  The muscles or cartilage can be strained by coughing or overwork.  The cartilage can be affected by inflammation and become sore (costochondritis). DIAGNOSIS  Lab tests or other studies, such as X-rays, electrocardiography, stress testing, or cardiac imaging, may be needed to find the cause of your pain.  TREATMENT   Treatment depends on what may be causing your chest pain. Treatment may include:  Acid blockers for heartburn.  Anti-inflammatory medicine.  Pain medicine for inflammatory conditions.  Antibiotics if an infection is present.  You may be advised to change lifestyle habits. This includes stopping smoking and  avoiding alcohol, caffeine, and chocolate.  You may be advised to keep your head raised (elevated) when sleeping. This reduces the chance of acid going backward from your stomach into your esophagus.  Most of the time, nonspecific chest pain will improve within 2 to 3 days with rest and mild pain medicine. HOME CARE INSTRUCTIONS   If antibiotics were prescribed, take your antibiotics as directed. Finish them even if you start to feel better.  For the next few days, avoid physical activities that bring on chest pain. Continue physical activities as directed.  Do not smoke.  Avoid drinking alcohol.  Only take over-the-counter or prescription medicine for pain, discomfort, or fever as directed by your caregiver.  Follow your caregiver's suggestions for further testing if your chest pain does not go away.  Keep any follow-up appointments you made. If you do not go to an appointment, you could develop lasting (chronic) problems with pain. If there is any problem keeping an appointment, you must call to reschedule. SEEK MEDICAL CARE IF:   You think you are having problems from the medicine you are taking. Read your medicine instructions carefully.  Your chest pain does not go away, even after treatment.  You develop a rash with blisters on your chest. SEEK IMMEDIATE MEDICAL CARE IF:   You have increased chest pain or pain that spreads to your arm, neck, jaw, back, or abdomen.  You develop shortness of breath, an increasing cough, or you are  coughing up blood.  You have severe back or abdominal pain, feel nauseous, or vomit.  You develop severe weakness, fainting, or chills.  You have a fever. THIS IS AN EMERGENCY. Do not wait to see if the pain will go away. Get medical help at once. Call your local emergency services (911 in [place] Do not drive yourself to the hospital. MAKE SURE YOU:   Understand these instructions.  Will watch your condition.  Will get help right away if you  are not doing well or get worse. Document Released: 07/31/2005 Document Revised: 01/13/2012 Document Reviewed: 05/26/2008 [facility] Patient Information 2013 [place], [place].

## 2012-11-12 NOTE — Progress Notes (Signed)
Chief Complaint  Patient presents with  . Abdominal Pain    The pain is on and off.  States that is was unbearable yesterday.  Ongoing for 1 year.  . Chest Pain    HPI: Patient comes in today for SDA for  new problem evaluation. Intermittent Chest pain off and on for quite a while Tightness and then shar p pain.  ? If could be stress.  It is getting worse and was more severe yesterday usually occurs About once a week  Yesterday lasted all day.  Off an on  All today  Some interference sleep   Positional worse when she lays on her left side no cough  Vomiting sob  Sometimes pain down upper arm .    No help with ibu and no help with zantac and no help. This morning she awoke with lower abdominal cramps her periods have been regular no recent relationship. No UTI symptoms or vaginal symptoms Anxiety  Happening recently.   Irritable . Lots of external stress doesn't have a counselor but does have a lot of friends that she can discuss things with.  ROS: See pertinent positives and negatives per HPI. No fever unusual rashes joint pains vision hearing changes  She never started the birth control pill and is not on it at this time. Denies any significant alcohol or tobacco some caffeine sweet tea and soda.  Past Medical History  Diagnosis Date  . HSV (herpes simplex virus) anogenital infection   . 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 PID (acute pelvic inflammatory disease) 01/11/2011  . HERPES GENITALIS 11/02/2010    Qualifier: Diagnosis of  By: Fabian Sharp MD, Neta Mends     Family History  Problem Relation Age of Onset  . Hypertension    . Gallbladder disease Mother   . Diabetes    . Asthma    . Anemia    . Allergies    . Migraines      History   Social History  . Marital Status: Single    Spouse Name: N/A    Number of Children: N/A  . Years of Education: N/A   Social History Main Topics  . Smoking status: Never Smoker   . Smokeless tobacco: Never Used  .  Alcohol Use: No  . Drug Use: No  . Sexually Active: None   Other Topics Concern  . None   Social History Narrative   ? Ets, in the past Mom smoked and now has stopped this year.Non smoker Works Jimmie JohnsParents recently separated and divorcing stressful.Was  5 # 9 oz FT  gestation    Outpatient Encounter Prescriptions as of 11/12/2012  Medication Sig Dispense Refill  . ranitidine (ZANTAC) 150 MG tablet Take 1 tablet (150 mg total) by mouth 2 (two) times daily.  60 tablet  4  . ALPRAZolam (XANAX) 0.25 MG tablet Take 0.25 mg by mouth at bedtime as needed. 1/2 to 1 tab by mouth bid as needed for anxiety and sob       . valACYclovir (VALTREX) 500 MG tablet Take 500 mg by mouth daily.        . [DISCONTINUED] Norethindrone Acet-Ethinyl Est 1.5-30 MG-MCG TABS Take 1 tablet by mouth daily.  28 each  4  . [DISCONTINUED] cefTRIAXone (ROCEPHIN) 250 mg in lidocaine (XYLOCAINE) 1 % IM only syringe         EXAM:  BP 110/70  Pulse 65  Temp 98.2 F (36.8 C) (Oral)  Wt 207  lb (93.895 kg)  SpO2 99%  LMP 11/02/2012  There is no height on file to calculate BMI. Wt Readings from Last 3 Encounters:  11/12/12 207 lb (93.895 kg)  10/20/12 208 lb 12.8 oz (94.711 kg)  08/24/12 210 lb 3.2 oz (95.346 kg)    GENERAL: vitals reviewed and listed above, alert, oriented, appears well hydrated and in no acute distress mildly anxious cognitively intact  HEENT: atraumatic, conjunctiva  clear, no obvious abnormalities on inspection of external nose and ears OP : no lesion edema or exudate uvula midline  NECK: no obvious masses on inspection palpation thyroid is easily palpable no obvious nodules  LUNGS: clear to auscultation bilaterally, no wheezes, rales or rhonchi, good air movement Chest question tenderness left upper middle chest wall no lesions noted CV: HRRR, no clubbing cyanosis or  peripheral edema nl cap refill  Abdomen soft without organomegaly guarding rebound or flank pain MS: moves all  extremities without noticeable focal  abnormality EKG shows normal sinus rhythm no acute changes PSYCH: pleasant and cooperative, no obvious depression  looks slightly stressed Neurologic grossly nonfocal. Extremities negative CCE  ASSESSMENT AND PLAN:  Discussed the following assessment and plan:  1. Chest pain, atypical  EKG 12-Lead, Basic metabolic panel, CBC with Differential, TSH, T4, free, Sedimentation rate, HIV antibody, RPR, Hepatic function panel, GC/chlamydia probe amp, urine, POCT Pregnancy, Urine, D-dimer, quantitative, DG Chest 2 View   Left-sided uncertain cause I suppose it could be chest wall but a bit atypical for all lab work chest x-ray and close followup  2. Goiter, unspecified  EKG 12-Lead, Basic metabolic panel, CBC with Differential, TSH, T4, free, Sedimentation rate, HIV antibody, RPR, Hepatic function panel, GC/chlamydia probe amp, urine, POCT Pregnancy, Urine   Repeat thyroid test it's been over a year  3. Abdominal pain in female patient     This appears to be minor and just today remote history of Chlamydia we'll check screening today   review in a couple weeks. Told to limit the caffeine in case this is aggravating and definitely cut sugar out of or drink such as sweet tea. -Patient advised to return or notify health care team  immediately if symptoms worsen or persist or new concerns arise. Advised sign up my chart again. Patient Instructions  Uncertain cause of your chest pain but it doesn't appear to be your heart. And doesn't act like one of the serious causes but we will follow this up.chest  Your EKG is normal this could be stress or chest wall pain. Advised to get a chest x-ray to make sure your lungs look okay and her not a cause of some pain.  We can also get laboratory test to rule out infection  Thyroid issue.  Could be stress   Would like y ou to return in 1-2 weeks  To go over labs and how you are doing.    Chest Pain (Nonspecific) It is often  hard to give a specific diagnosis for the cause of chest pain. There is always a chance that your pain could be related to something serious, such as a heart attack or a blood clot in the lungs. You need to follow up with your caregiver for further evaluation. CAUSES   Heartburn.  Pneumonia or bronchitis.  Anxiety or stress.  Inflammation around your heart (pericarditis) or lung (pleuritis or pleurisy).  A blood clot in the lung.  A collapsed lung (pneumothorax). It can develop suddenly on its own (spontaneous pneumothorax) or from injury (  trauma) to the chest.  Shingles infection (herpes zoster virus). The chest wall is composed of bones, muscles, and cartilage. Any of these can be the source of the pain.  The bones can be bruised by injury.  The muscles or cartilage can be strained by coughing or overwork.  The cartilage can be affected by inflammation and become sore (costochondritis). DIAGNOSIS  Lab tests or other studies, such as X-rays, electrocardiography, stress testing, or cardiac imaging, may be needed to find the cause of your pain.  TREATMENT   Treatment depends on what may be causing your chest pain. Treatment may include:  Acid blockers for heartburn.  Anti-inflammatory medicine.  Pain medicine for inflammatory conditions.  Antibiotics if an infection is present.  You may be advised to change lifestyle habits. This includes stopping smoking and avoiding alcohol, caffeine, and chocolate.  You may be advised to keep your head raised (elevated) when sleeping. This reduces the chance of acid going backward from your stomach into your esophagus.  Most of the time, nonspecific chest pain will improve within 2 to 3 days with rest and mild pain medicine. HOME CARE INSTRUCTIONS   If antibiotics were prescribed, take your antibiotics as directed. Finish them even if you start to feel better.  For the next few days, avoid physical activities that bring on chest pain.  Continue physical activities as directed.  Do not smoke.  Avoid drinking alcohol.  Only take over-the-counter or prescription medicine for pain, discomfort, or fever as directed by your caregiver.  Follow your caregiver's suggestions for further testing if your chest pain does not go away.  Keep any follow-up appointments you made. If you do not go to an appointment, you could develop lasting (chronic) problems with pain. If there is any problem keeping an appointment, you must call to reschedule. SEEK MEDICAL CARE IF:   You think you are having problems from the medicine you are taking. Read your medicine instructions carefully.  Your chest pain does not go away, even after treatment.  You develop a rash with blisters on your chest. SEEK IMMEDIATE MEDICAL CARE IF:   You have increased chest pain or pain that spreads to your arm, neck, jaw, back, or abdomen.  You develop shortness of breath, an increasing cough, or you are coughing up blood.  You have severe back or abdominal pain, feel nauseous, or vomit.  You develop severe weakness, fainting, or chills.  You have a fever. THIS IS AN EMERGENCY. Do not wait to see if the pain will go away. Get medical help at once. Call your local emergency services (911 in [place] Do not drive yourself to the hospital. MAKE SURE YOU:   Understand these instructions.  Will watch your condition.  Will get help right away if you are not doing well or get worse. Document Released: 07/31/2005 Document Revised: 01/13/2012 Document Reviewed: 05/26/2008 [facility] Patient Information 2013 [place], [place].      Neta Mends. Panosh M.D.

## 2012-11-13 ENCOUNTER — Encounter: Payer: Self-pay | Admitting: Family Medicine

## 2012-11-13 ENCOUNTER — Ambulatory Visit (INDEPENDENT_AMBULATORY_CARE_PROVIDER_SITE_OTHER)
Admission: RE | Admit: 2012-11-13 | Discharge: 2012-11-13 | Disposition: A | Discharge status: Home / Self Care | Payer: 59 | Source: Ambulatory Visit | Attending: Internal Medicine | Admitting: Internal Medicine

## 2012-11-13 ENCOUNTER — Other Ambulatory Visit: Payer: Self-pay | Admitting: Internal Medicine

## 2012-11-13 DIAGNOSIS — R7989 Other specified abnormal findings of blood chemistry: Secondary | ICD-10-CM

## 2012-11-13 DIAGNOSIS — R079 Chest pain, unspecified: Secondary | ICD-10-CM

## 2012-11-13 DIAGNOSIS — R791 Abnormal coagulation profile: Secondary | ICD-10-CM

## 2012-11-13 LAB — D-DIMER, QUANTITATIVE: D-Dimer, Quant: 1.42 ug/mL-FEU — ABNORMAL HIGH (ref 0.00–0.48)

## 2012-11-13 MED ORDER — IOHEXOL 350 MG/ML SOLN
80.0000 mL | Freq: Once | INTRAVENOUS | Status: AC | PRN
  Administered 2012-11-13: 80 mL via INTRAVENOUS

## 2012-11-13 MED ORDER — AZITHROMYCIN 500 MG PO TABS: 1000.0000 mg | ORAL_TABLET | Freq: Once | ORAL | Status: DC

## 2012-11-27 ENCOUNTER — Ambulatory Visit (INDEPENDENT_AMBULATORY_CARE_PROVIDER_SITE_OTHER): Payer: 59 | Admitting: Internal Medicine

## 2012-11-27 ENCOUNTER — Encounter: Payer: Self-pay | Admitting: Internal Medicine

## 2012-11-27 VITALS — BP 120/60 | HR 105 | Temp 98.0°F | Wt 210.0 lb

## 2012-11-27 DIAGNOSIS — R0789 Other chest pain: Secondary | ICD-10-CM

## 2012-11-27 DIAGNOSIS — R7989 Other specified abnormal findings of blood chemistry: Secondary | ICD-10-CM

## 2012-11-27 DIAGNOSIS — R791 Abnormal coagulation profile: Secondary | ICD-10-CM

## 2012-11-27 DIAGNOSIS — D649 Anemia, unspecified: Secondary | ICD-10-CM

## 2012-11-27 NOTE — Progress Notes (Signed)
Chief Complaint  Patient presents with  . Follow-up    chest pain    HPI: Patient comes in today for follow up of  medical problems. See last OV  atypcial chest pain pos d dimer neg chest ct . For clot ;pain  Went away a few days   ago . No new sx with this . No nvd cough   nosob cough asthmatic sx now.  ROS: See pertinent positives and negatives per HPI. Not taking reg meds  See prev noteds  Past Medical History  Diagnosis Date  . HSV (herpes simplex virus) anogenital infection   . 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 PID (acute pelvic inflammatory disease) 01/11/2011  . HERPES GENITALIS 11/02/2010    Qualifier: Diagnosis of  By: Fabian Sharp MD, Neta Mends     Family History  Problem Relation Age of Onset  . Hypertension    . Gallbladder disease Mother   . Diabetes    . Asthma    . Anemia    . Allergies    . Migraines      History   Social History  . Marital Status: Single    Spouse Name: N/A    Number of Children: N/A  . Years of Education: N/A   Social History Main Topics  . Smoking status: Never Smoker   . Smokeless tobacco: Never Used  . Alcohol Use: No  . Drug Use: No  . Sexually Active: None   Other Topics Concern  . None   Social History Narrative   ? Ets, in the past Mom smoked and now has stopped this year.Non smoker Works [employer] JohnsParents recently separated and divorcing stressful.Was  5 # 9 oz FT  gestation    Outpatient Encounter Prescriptions as of 11/27/2012  Medication Sig Dispense Refill  . ranitidine (ZANTAC) 150 MG tablet Take 1 tablet (150 mg total) by mouth 2 (two) times daily.  60 tablet  4  . valACYclovir (VALTREX) 500 MG tablet Take 500 mg by mouth daily.        . [DISCONTINUED] ALPRAZolam (XANAX) 0.25 MG tablet Take 0.25 mg by mouth at bedtime as needed. 1/2 to 1 tab by mouth bid as needed for anxiety and sob       . [DISCONTINUED] azithromycin (ZITHROMAX) 500 MG tablet Take 2 tablets (1,000 mg total) by mouth  once.  2 tablet  0    EXAM:  BP 120/60  Pulse 105  Temp 98 F (36.7 C) (Oral)  Wt 210 lb (95.255 kg)  SpO2 98%  LMP 11/02/2012  There is no height on file to calculate BMI.  GENERAL: vitals reviewed and listed above, alert, oriented, appears well hydrated and in no acute distress  HEENT: atraumatic, conjunctiva  clear, no obvious abnormalities on inspection of external nose and ears LUNGS: clear to auscultation bilaterally, no wheezes, rales or rhonchi, good air movement No chest wall tenderness  CV: HRRR, no clubbing cyanosis or  peripheral edema nl cap refill MS: moves all extremities without noticeable focal  abnormality PSYCH: pleasant and cooperative, no obvious depression or anxiety Reviewed labs   Ct scan no clot   Tiny nodules  Not at risk   No fu needed  By risk hx     Non smoker although family hx of tobacco  Use. Lab Results  Component Value Date   WBC 6.7 11/12/2012   HGB 11.4* 11/12/2012   HCT 35.6* 11/12/2012   PLT 391.0 11/12/2012   GLUCOSE  76 11/12/2012   CHOL 148 05/30/2009   TRIG 72.0 05/30/2009   HDL 44.20 05/30/2009   LDLCALC 89 05/30/2009   ALT 19 11/12/2012   AST 26 11/12/2012   NA 137 11/12/2012   K 3.8 11/12/2012   CL 103 11/12/2012   CREATININE 0.8 11/12/2012   BUN 8 11/12/2012   CO2 28 11/12/2012   TSH 0.98 11/12/2012    ASSESSMENT AND PLAN:  Discussed the following assessment and plan:  1. Atypical chest pain    resolved .   2. Mild anemia    presumed iron deficiency  3. Positive D dimer    ct scan neg for clot   resolved   No sig findings     Fu if recurring   And in 6 months   Reported to patient that we treated her for chlamydia as i must have misread the lab report as positive when it was negative .  And no needed fu for this.  Apologies . Took the one dose of azithro no sx.  -Patient advised to return or notify health care team  immediately if symptoms worsen or persist or new concerns arise.  On review or record it has been ? 2 years+ since her last pap   Plan pap pelvic in about 6 months or as needed .  Patient Instructions  Your exam and labs are good. This could be stress or chest wall pain however if  persistent or progressive can recheck to reassess if needed.   ROV   In about 6 months for pap and pelvic .      Neta Mends. Celicia Minahan M.D.

## 2012-11-27 NOTE — Patient Instructions (Signed)
Your exam and labs are good. This could be stress or chest wall pain however if  persistent or progressive can recheck to reassess if needed.   ROV   In about 6 months for pap and pelvic .

## 2012-11-28 ENCOUNTER — Encounter: Payer: Self-pay | Admitting: Internal Medicine

## 2012-11-28 DIAGNOSIS — D649 Anemia, unspecified: Secondary | ICD-10-CM | POA: Insufficient documentation

## 2012-11-28 DIAGNOSIS — R7989 Other specified abnormal findings of blood chemistry: Secondary | ICD-10-CM | POA: Insufficient documentation

## 2013-04-25 ENCOUNTER — Emergency Department (HOSPITAL_COMMUNITY)
Admission: EM | Admit: 2013-04-25 | Discharge: 2013-04-26 | Disposition: A | Discharge status: Home / Self Care | Payer: 59 | Attending: Emergency Medicine | Admitting: Emergency Medicine

## 2013-04-25 DIAGNOSIS — Z8742 Personal history of other diseases of the female genital tract: Secondary | ICD-10-CM | POA: Insufficient documentation

## 2013-04-25 DIAGNOSIS — B009 Herpesviral infection, unspecified: Secondary | ICD-10-CM | POA: Insufficient documentation

## 2013-04-25 DIAGNOSIS — A7481 Chlamydial peritonitis: Secondary | ICD-10-CM | POA: Insufficient documentation

## 2013-04-25 DIAGNOSIS — H53149 Visual discomfort, unspecified: Secondary | ICD-10-CM | POA: Insufficient documentation

## 2013-04-25 DIAGNOSIS — H109 Unspecified conjunctivitis: Secondary | ICD-10-CM | POA: Insufficient documentation

## 2013-04-25 NOTE — ED Notes (Signed)
Pt stated sharp pain to R eye starting at 2000, 8/10, now to bilateral eyes.  Pt stated that vision is blurry, positive for photophobia.

## 2013-04-26 ENCOUNTER — Encounter (HOSPITAL_COMMUNITY): Payer: Self-pay | Admitting: Emergency Medicine

## 2013-04-26 MED ORDER — HYDROCODONE-ACETAMINOPHEN 5-325 MG PO TABS
1.0000 | ORAL_TABLET | Freq: Once | ORAL | Status: AC
  Administered 2013-04-26: 1 via ORAL
  Filled 2013-04-26: qty 1

## 2013-04-26 MED ORDER — CIPROFLOXACIN HCL 0.3 % OP OINT: TOPICAL_OINTMENT | OPHTHALMIC | Status: DC

## 2013-04-26 MED ORDER — HYDROCODONE-ACETAMINOPHEN 5-325 MG PO TABS: 1.0000 | ORAL_TABLET | ORAL | Status: DC | PRN

## 2013-04-26 MED ORDER — TETRACAINE HCL 0.5 % OP SOLN
1.0000 [drp] | Freq: Once | OPHTHALMIC | Status: AC
  Administered 2013-04-26: 2 [drp] via OPHTHALMIC
  Filled 2013-04-26: qty 2

## 2013-04-26 NOTE — ED Provider Notes (Signed)
History     CSN: [id number]  Arrival date & time 04/25/13  2347   First MD Initiated Contact with Patient 04/26/13 0000      Chief Complaint  Patient presents with  . Eye Pain    (Consider location/radiation/quality/duration/timing/severity/associated sxs/prior treatment) HPI  Patient is a 22 yo F presenting to the ED for sudden onset bilateral sharp eye pain w/ injection and associated photophobia around 8PM this evening. Pt denies any visual disturbances, but did immediately take her contacts out immediately after the pain began. Rates pain 10/10. No alleviating factors. Patient denies any drainage, periorbital or orbital swelling, HA.   Past Medical History  Diagnosis Date  . HSV (herpes simplex virus) anogenital infection   . 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 PID (acute pelvic inflammatory disease) 01/11/2011  . HERPES GENITALIS 11/02/2010    Qualifier: Diagnosis of  By: Fabian Sharp MD, Neta Mends     History reviewed. No pertinent past surgical history.  Family History  Problem Relation Age of Onset  . Hypertension    . Gallbladder disease Mother   . Diabetes    . Asthma    . Anemia    . Allergies    . Migraines      History  Substance Use Topics  . Smoking status: Never Smoker   . Smokeless tobacco: Never Used  . Alcohol Use: No    OB History   Grav Para Term Preterm Abortions TAB SAB Ect Mult Living                  Review of Systems  Constitutional: Negative for fever and chills.  HENT: Negative.   Eyes: Positive for photophobia, pain and redness. Negative for itching.  Neurological: Negative for headaches.    Allergies  Review of patient's allergies indicates no known allergies.  Home Medications   Current Outpatient Rx  Name  Route  Sig  Dispense  Refill  . ciprofloxacin (CILOXAN) 0.3 % ophthalmic ointment      Instill 1-2 drops in each eye every two hours for the first two days. Instill 1-2 drops in each eye every  four hours for the next three to seven days.   3.5 g   0   . HYDROcodone-acetaminophen (NORCO/VICODIN) 5-325 MG per tablet   Oral   Take 1 tablet by mouth every 4 (four) hours as needed for pain.   6 tablet   0     BP 130/82  Pulse 63  Temp(Src) 98.2 F (36.8 C) (Oral)  Resp 18  Ht 5\' 3"  (1.6 m)  Wt 206 lb (93.441 kg)  BMI 36.5 kg/m2  SpO2 100%  LMP 04/20/2013  Physical Exam  Constitutional: She is oriented to person, place, and time. She appears well-developed and well-nourished. No distress.  HENT:  Head: Normocephalic and atraumatic.  Eyes: EOM are normal. Pupils are equal, round, and reactive to light. Right eye exhibits no discharge. Left eye exhibits no discharge. Right conjunctiva is injected. Right conjunctiva has no hemorrhage. Left conjunctiva is injected. Left conjunctiva has no hemorrhage.  Slit lamp exam:      The right eye shows no corneal abrasion, no corneal flare, no corneal ulcer and no foreign body.       The left eye shows no corneal abrasion, no corneal flare, no corneal ulcer and no foreign body.  Neck: Neck supple.  Neurological: She is alert and oriented to person, place, and time.  Skin: Skin is warm and  dry. She is not diaphoretic.  Psychiatric: She has a normal mood and affect.    ED Course  Procedures (including critical care time)  Labs Reviewed - No data to display No results found.   1. Conjunctivitis of both eyes       MDM  Patient presentation consistent with bilateral conjunctivitis.  No purulent discharge, corneal abrasions, entrapment, consensual photophobia, or dendritic staining with fluorescein study.  Presentation non-concerning for iritis, corneal abrasions, or HSV.  Antibiotics are indicated and patient will be prescribed cipro drops as she is a contact lens wearer.  Personal hygiene and frequent handwashing discussed.  Patient advised to followup with ophthalmologist if symptoms persist or worsen in any way including vision  change or purulent discharge.  Return precautions discussed. Patient verbalizes understanding and is agreeable with discharge. Patient d/w with Dr. Estell Harpin, agrees with plan. Patient is stable at time of discharge          Jeannetta Ellis, PA-C 04/26/13 0121

## 2013-04-26 NOTE — ED Provider Notes (Signed)
Medical screening examination/treatment/procedure(s) were performed by non-physician practitioner and as supervising physician I was immediately available for consultation/collaboration.   Milissa Fesperman L Azeneth Carbonell, MD 04/26/13 0742 

## 2013-05-27 ENCOUNTER — Ambulatory Visit: Payer: 59 | Admitting: Internal Medicine

## 2014-08-31 ENCOUNTER — Ambulatory Visit (INDEPENDENT_AMBULATORY_CARE_PROVIDER_SITE_OTHER): Payer: Self-pay | Admitting: Family Medicine

## 2014-08-31 ENCOUNTER — Encounter: Payer: Self-pay | Admitting: Family Medicine

## 2014-08-31 VITALS — BP 120/68 | Temp 98.8°F | Wt 210.0 lb

## 2014-08-31 DIAGNOSIS — Z331 Pregnant state, incidental: Secondary | ICD-10-CM

## 2014-08-31 DIAGNOSIS — Z3490 Encounter for supervision of normal pregnancy, unspecified, unspecified trimester: Secondary | ICD-10-CM

## 2014-08-31 DIAGNOSIS — R112 Nausea with vomiting, unspecified: Secondary | ICD-10-CM

## 2014-08-31 LAB — POCT URINALYSIS DIPSTICK
Bilirubin, UA: NEGATIVE
Glucose, UA: NEGATIVE
Nitrite, UA: NEGATIVE
PH UA: 6
PROTEIN UA: NEGATIVE
RBC UA: NEGATIVE
SPEC GRAV UA: 1.02
Urobilinogen, UA: 0.2

## 2014-08-31 LAB — POCT URINE PREGNANCY: Preg Test, Ur: POSITIVE

## 2014-08-31 MED ORDER — PRENATAL VITAMIN 27-0.8 MG PO TABS: 1.0000 | ORAL_TABLET | Freq: Every day | ORAL | Status: DC

## 2014-08-31 NOTE — Patient Instructions (Addendum)
Urine pregnancy test was positive. Suspect you are somewhere 4-[redacted] weeks pregnant. You will need an early ultrasound as dates are unclear so we have referred you to OB/GYN.   Dr. Fabian SharpPanosh would like to see you tomorrow or Friday to discuss where to go from here as you are undecided.   I would encourage a prenatal vitamin at this point (called one in but you will likely just purchase over the counter)  For Nausea-you can try ginger, unisom and if that doesn't work we could call in zofran for you  We also sent your urine for cultrure.

## 2014-08-31 NOTE — Progress Notes (Signed)
  Tana ConchStephen Kennia Vanvorst, MD Phone: [phone]  Subjective:   Alyssa NatalMorrissia B Lloyd is a 23 y.o. year old very pleasant female patient who presents with the following:  Nausea Nausea/uneasiness in stomach. Difficulty holding food downs. Only been able to tolerate liquids, crackers, salad. Has green stool but no diarrhea or constipation-daily bowel movements. Symptoms started Last Thursday when ate some peaches and threw up. Had mild headache at that time, thought she was just hungry. No known sick contacts. 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 Denies new food contacts or foreign travel. No particular abdominal pain but feels bloated with occasional cramping.   Not currently on period. LMP 9/25-30/15 (not certain). Sexually active a few months ago (early September). No birth control pills or medical contraceptive but used condom.   ROS- subjective fevers/occasional chill. No bilious or bloody emesis.   Past Medical History-Mild anemia from periods Medications- no medications  Objective: BP 120/68  Temp(Src) 98.8 F (37.1 C)  Wt 210 lb (95.255 kg) Gen: NAD, resting comfortably on table HEENT: Mucous membranes are moist. CV: RRR no murmurs rubs or gallops Lungs: CTAB no crackles, wheeze, rhonchi Abdomen: soft/nontender/nondistended/normal bowel sounds.  Ext: no edema Skin: warm, dry, no rash Neuro: grossly normal, moves all extremities   Results for orders placed in visit on 08/31/14 (from the past 24 hour(s))  POCT URINE PREGNANCY     Status: None   Collection Time    08/31/14  3:54 PM      Result Value Ref Range   Preg Test, Ur Positive    POCT URINALYSIS DIPSTICK     Status: None   Collection Time    08/31/14  4:12 PM      Result Value Ref Range   Color, UA brown     Clarity, UA clear     Glucose, UA n     Bilirubin, UA n     Ketones, UA trace     Spec Grav, UA 1.020     Blood, UA n     pH, UA 6.0     Protein, UA n      Urobilinogen, UA 0.2     Nitrite, UA n     Leukocytes, UA Trace      Assessment/Plan:  Nausea Pregnancy Due to pregnancy confirmed on urine pregnancy. Unclear dating (07/29/14 would be latest based off LMP but sexually active early September and not since that time) and referred to ob/gyn as would need early dating ultrasound. Patient is not clear if she would want to keep pregnancy. She is meeting with her sexual partner later today and plans to see Dr. Fabian SharpPanosh Thursday or Friday to discuss further. I did recommend a PNV while waiting as well as some symptomatic care for nausea.

## 2014-09-02 ENCOUNTER — Ambulatory Visit (INDEPENDENT_AMBULATORY_CARE_PROVIDER_SITE_OTHER): Payer: Self-pay | Admitting: Internal Medicine

## 2014-09-02 ENCOUNTER — Encounter: Payer: Self-pay | Admitting: Internal Medicine

## 2014-09-02 VITALS — BP 104/54 | Temp 98.4°F | Ht 63.0 in | Wt 210.0 lb

## 2014-09-02 DIAGNOSIS — R112 Nausea with vomiting, unspecified: Secondary | ICD-10-CM

## 2014-09-02 DIAGNOSIS — Z3201 Encounter for pregnancy test, result positive: Secondary | ICD-10-CM

## 2014-09-02 DIAGNOSIS — Z3490 Encounter for supervision of normal pregnancy, unspecified, unspecified trimester: Secondary | ICD-10-CM

## 2014-09-02 LAB — URINE CULTURE
Colony Count: NO GROWTH
Organism ID, Bacteria: NO GROWTH

## 2014-09-02 NOTE — Progress Notes (Signed)
Pre visit review using our clinic review tool, if applicable. No additional management support is needed unless otherwise documented below in the visit note.  Chief Complaint  Patient presents with  . Follow-up    HPI: Alyssa Lloyd is 23 y.o. comes in   Fu of Ov dx  With pregnancy  Here with partner  To discuss  lmp before sept 24 or therabouts  Gi sxc this week  Thought was a stomach virus.  Works [employer]  Was using condoms off and on . Periods regular   Monthly and last one was normal  And on time ROS: See pertinent positives and negatives per HPI.  Past Medical History  Diagnosis Date  . HSV (herpes simplex virus) anogenital infection   . 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 PID (acute pelvic inflammatory disease) 01/11/2011  . HERPES GENITALIS 11/02/2010    Qualifier: Diagnosis of  By: Fabian SharpPanosh MD, Neta MendsWanda K     Family History  Problem Relation Age of Onset  . Hypertension    . Gallbladder disease Mother   . Diabetes    . Asthma    . Anemia    . Allergies    . Migraines      History   Social History  . Marital Status: Single    Spouse Name: N/A    Number of Children: N/A  . Years of Education: N/A   Social History Main Topics  . Smoking status: Never Smoker   . Smokeless tobacco: Never Used  . Alcohol Use: No  . Drug Use: No  . Sexual Activity: None   Other Topics Concern  . None   Social History Narrative   ? Ets, in the past    Mom smoked and now has stopped this year.   Non smoker    Works [employer]   Parents recently separated and divorcing stressful.      Was  5 # 9 oz FT  gestation    Outpatient Encounter Prescriptions as of 09/02/2014  Medication Sig  . Prenatal Vit-Fe Fumarate-FA (PRENATAL VITAMIN) 27-0.8 MG TABS Take 1 tablet by mouth daily.    EXAM:  BP 104/54  Temp(Src) 98.4 F (36.9 C) (Oral)  Ht 5\' 3"  (1.6 m)  Wt 210 lb (95.255 kg)  BMI 37.21 kg/m2  Body mass index is 37.21  kg/(m^2).  GENERAL: vitals reviewed and listed above, alert, oriented, appears well hydrated and in no acute distress HEENT: atraumatic, conjunctiva  clear, no obvious abnormalities on inspection of external nose and ears  Abdomen:  Sof,t normal bowel sounds without hepatosplenomegaly, no guarding rebound or masses no CVA tenderness PSYCH: pleasant and cooperative, no obvious depression or anxiety  ASSESSMENT AND PLAN:  Discussed the following assessment and plan:  Pregnancy - unplanned 5-6 weeks by dates  hx    Nausea and vomiting, vomiting of unspecified type - hydration good  Counseling options  Self care hydration names of obgyne. To call   .     Expectant management. Health risks etc .  Disc  obgyne office and agencies depending on  Decision. Not for work as has been out . Hydration seems adequate today -Patient advised to return or notify health care team  if symptoms worsen ,persist or new concerns arise.  Patient Instructions  Prenatal vitamins  Keep hydrated .  Let us know if we can help .  Neta MendsWanda K. Lagina Reader M.D.  Total visit 25mins > 50% spent counseling and coordinating  care

## 2014-09-02 NOTE — Patient Instructions (Signed)
Prenatal vitamins  Keep hydrated .  Let us know if we can help .

## 2015-03-07 ENCOUNTER — Other Ambulatory Visit (HOSPITAL_COMMUNITY)
Admission: RE | Admit: 2015-03-07 | Discharge: 2015-03-07 | Disposition: A | Discharge status: Home / Self Care | Payer: Self-pay | Source: Ambulatory Visit | Attending: Internal Medicine | Admitting: Internal Medicine

## 2015-03-07 DIAGNOSIS — N760 Acute vaginitis: Secondary | ICD-10-CM | POA: Insufficient documentation

## 2015-03-14 ENCOUNTER — Encounter: Payer: Self-pay | Admitting: Internal Medicine

## 2015-04-04 ENCOUNTER — Encounter: Payer: Self-pay | Admitting: Internal Medicine

## 2015-04-04 ENCOUNTER — Other Ambulatory Visit (HOSPITAL_COMMUNITY)
Admission: RE | Admit: 2015-04-04 | Discharge: 2015-04-04 | Disposition: A | Discharge status: Home / Self Care | Payer: Self-pay | Source: Ambulatory Visit | Attending: Internal Medicine | Admitting: Internal Medicine

## 2015-04-04 ENCOUNTER — Ambulatory Visit (INDEPENDENT_AMBULATORY_CARE_PROVIDER_SITE_OTHER): Payer: Self-pay | Admitting: Internal Medicine

## 2015-04-04 VITALS — BP 116/66 | Temp 98.3°F | Ht 63.5 in | Wt 213.1 lb

## 2015-04-04 DIAGNOSIS — Z23 Encounter for immunization: Secondary | ICD-10-CM

## 2015-04-04 DIAGNOSIS — Z0000 Encounter for general adult medical examination without abnormal findings: Secondary | ICD-10-CM

## 2015-04-04 DIAGNOSIS — N760 Acute vaginitis: Secondary | ICD-10-CM | POA: Insufficient documentation

## 2015-04-04 DIAGNOSIS — Z01419 Encounter for gynecological examination (general) (routine) without abnormal findings: Secondary | ICD-10-CM | POA: Insufficient documentation

## 2015-04-04 DIAGNOSIS — N898 Other specified noninflammatory disorders of vagina: Secondary | ICD-10-CM

## 2015-04-04 DIAGNOSIS — Z113 Encounter for screening for infections with a predominantly sexual mode of transmission: Secondary | ICD-10-CM | POA: Insufficient documentation

## 2015-04-04 DIAGNOSIS — R490 Dysphonia: Secondary | ICD-10-CM

## 2015-04-04 LAB — CBC WITH DIFFERENTIAL/PLATELET
BASOS ABS: 0 10*3/uL (ref 0.0–0.1)
BASOS PCT: 0.5 % (ref 0.0–3.0)
EOS ABS: 0.6 10*3/uL (ref 0.0–0.7)
Eosinophils Relative: 11.5 % — ABNORMAL HIGH (ref 0.0–5.0)
HEMATOCRIT: 36.4 % (ref 36.0–46.0)
HEMOGLOBIN: 11.6 g/dL — AB (ref 12.0–15.0)
LYMPHS PCT: 30.5 % (ref 12.0–46.0)
Lymphs Abs: 1.5 10*3/uL (ref 0.7–4.0)
MCHC: 31.8 g/dL (ref 30.0–36.0)
MCV: 78 fl (ref 78.0–100.0)
MONO ABS: 0.4 10*3/uL (ref 0.1–1.0)
Monocytes Relative: 8.5 % (ref 3.0–12.0)
NEUTROS ABS: 2.4 10*3/uL (ref 1.4–7.7)
NEUTROS PCT: 49 % (ref 43.0–77.0)
Platelets: 367 10*3/uL (ref 150.0–400.0)
RBC: 4.67 Mil/uL (ref 3.87–5.11)
RDW: 15.7 % — AB (ref 11.5–15.5)
WBC: 4.9 10*3/uL (ref 4.0–10.5)

## 2015-04-04 LAB — BASIC METABOLIC PANEL
BUN: 12 mg/dL (ref 6–23)
CO2: 26 meq/L (ref 19–32)
CREATININE: 0.81 mg/dL (ref 0.40–1.20)
Calcium: 9.2 mg/dL (ref 8.4–10.5)
Chloride: 104 mEq/L (ref 96–112)
GFR: 110.28 mL/min (ref >60.00)
GLUCOSE: 86 mg/dL (ref 70–99)
Potassium: 3.9 mEq/L (ref 3.5–5.1)
SODIUM: 135 meq/L (ref 135–145)

## 2015-04-04 LAB — HEPATIC FUNCTION PANEL
ALK PHOS: 63 U/L (ref 39–117)
ALT: 10 U/L (ref 0–35)
AST: 14 U/L (ref 0–37)
Albumin: 4.1 g/dL (ref 3.5–5.2)
BILIRUBIN DIRECT: 0.1 mg/dL (ref 0.0–0.3)
BILIRUBIN TOTAL: 0.3 mg/dL (ref 0.2–1.2)
Total Protein: 8 g/dL (ref 6.0–8.3)

## 2015-04-04 LAB — LIPID PANEL
CHOL/HDL RATIO: 3
Cholesterol: 136 mg/dL (ref 0–200)
HDL: 47.7 mg/dL (ref >39.00)
LDL Cholesterol: 79 mg/dL (ref 0–99)
NONHDL: 88.3
Triglycerides: 47 mg/dL (ref 0.0–149.0)
VLDL: 9.4 mg/dL (ref 0.0–40.0)

## 2015-04-04 LAB — TSH: TSH: 1.14 u[IU]/mL (ref 0.35–4.50)

## 2015-04-04 NOTE — Patient Instructions (Signed)
Continue lifestyle intervention healthy eating and exercise . Healthy lifestyle includes : At least 150 minutes of exercise weeks  , weight at healthy levels, which is usually   BMI 19-25. Avoid trans fats and processed foods;  Increase fresh fruits and veges to 5 servings per day. And avoid sweet beverages including tea and juice. Mediterranean diet with olive oil and nuts have been noted to be heart and brain healthy . Avoid tobacco products . Limit  alcohol to  7 per week for women and 14 servings for men.  Get adequate sleep . Wear seat belts . Don't text and drive .   Will notify you  of labs/pap  when available. Poss BV  If so then we can add antitbtionc Hoarseness can be caused by allergy voice strain , feflux and throat clearing  Will arrange ENT to check your vocal chords . As to cause of the hoarseness.

## 2015-04-04 NOTE — Progress Notes (Signed)
Pre visit review using our clinic review tool, if applicable. No additional management support is needed unless otherwise documented below in the visit note.  Chief Complaint  Patient presents with  . Annual Exam    needs  pap vag s hoarse    HPI: Patient  Alyssa Lloyd  24 y.o. comes in today for Preventive Health Care visit  And concern  Doing ok needs pap last in 2011 no sa since 12 15 . Some vag dc  Mild whites  Last partner over 6 months ago  Periods monthly 7 day as some cramps uses tylenol  g1 p 0 No need for contraception at this time . Concern hoarse for 3 months  Sings a lot church concern no allergy reflux meds  Not sore no dysphagia   Health Maintenance  Topic Date Due  . PAP SMEAR  08/24/2013  . INFLUENZA VACCINE  06/05/2015  . TETANUS/TDAP  04/03/2025  . HIV Screening  Completed   Health Maintenance Review LIFESTYLE:  Exercise:   Planet fittness  Tobacco/ETS:no Alcohol:   none Sugar beverages: sweet tea and soda.  Sleep: 6 hours  Drug use: no PAP: 2011  ROS:  GEN/ HEENT: No fever, significant weight changes sweats headaches vision problems hearing changes, CV/ PULM; No chest pain shortness of breath cough, syncope,edema  change in exercise tolerance. GI /GU: No adominal pain, vomiting, change in bowel habits. No blood in the stool.  SKIN/HEME: ,no acute skin rashes suspicious lesions or bleeding. No lymphadenopathy, nodules, masses.  NEURO/ PSYCH:  No neurologic signs such as weakness numbness. No depression anxiety. IMM/ Allergy: No unusual infections.  Allergy .   REST of 12 system review negative except as per HPI   Past Medical History  Diagnosis Date  . HSV (herpes simplex virus) anogenital infection   . 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 PID (acute pelvic inflammatory disease) 01/11/2011  . HERPES GENITALIS 11/02/2010    Qualifier: Diagnosis of  By: Fabian Sharp MD, Neta Mends     No past surgical history on  file.  Family History  Problem Relation Age of Onset  . Hypertension    . Gallbladder disease Mother   . Diabetes    . Asthma    . Anemia    . Allergies    . Migraines      History   Social History  . Marital Status: Single    Spouse Name: N/A  . Number of Children: N/A  . Years of Education: N/A   Social History Main Topics  . Smoking status: Never Smoker   . Smokeless tobacco: Never Used  . Alcohol Use: No  . Drug Use: No  . Sexual Activity: Not on file   Other Topics Concern  . None   Social History Narrative   ? Ets, in the past    Mom smoked and now has stopped this year.   Non smoker    Works [employer] recently separated and divorcing stressful.  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       Was  5 # 9 oz FT  gestation    Outpatient Prescriptions Prior to Visit  Medication Sig Dispense Refill  . Prenatal Vit-Fe Fumarate-FA (PRENATAL VITAMIN) 27-0.8 MG TABS Take 1 tablet by mouth daily. 30 tablet 11   No facility-administered medications prior to visit.     EXAM:  BP 116/66 mmHg  Temp(Src) 98.3 F (36.8 C) (Oral)  Ht  5' 3.5" (1.613 m)  Wt 213 lb 1.6 oz (96.662 kg)  BMI 37.15 kg/m2  LMP 03/10/2015  Body mass index is 37.15 kg/(m^2).  Physical Exam: Vital signs reviewed ZOX:WRUEGEN:This is a well-developed well-nourished alert cooperative    who appearsr stated age in no acute distress.  Mild hoarsenes  Wears glasses  HEENT: normocephalic atraumatic , Eyes: PERRL EOM's full, conjunctiva clear, Nares: paten,t no deformity discharge or tenderness., Ears: no deformity EAC's clear TMs with normal landmarks. Mouth: clear OP, no lesions, edema.  Tonsil 1+  Moist mucous membranes. Dentition in adequate repair. NECK: supple without masses, thyroid palpable  or bruits. CHEST/PULM:  Clear to auscultation and percussion breath sounds equal no wheeze , rales or rhonchi. No chest wall deformities or tenderness.Breast: normal by inspection . No dimpling,  discharge, masses, tenderness or discharge . CV: PMI is nondisplaced, S1 S2 no gallops, murmurs, rubs. Peripheral pulses are full without delay.No JVD .  ABDOMEN: Bowel sounds normal nontender  No guard or rebound, no hepato splenomegal no CVA tenderness.  No hernia. Extremtities:  No clubbing cyanosis or edema, no acute joint swelling or redness no focal atrophy NEURO:  Oriented x3, cranial nerves 3-12 appear to be intact, no obvious focal weakness,gait within normal limits no abnormal reflexes or asymmetrical SKIN: No acute rashes normal turgor, color, no bruising or petechiae. PSYCH: Oriented, good eye contact, no obvious depression anxiety, cognition and judgment appear normal. LN: no cervical axillary inguinal adenopathy Pelvic: NL ext GU, labia clear without lesions or rash . Vagina no lesions .Cervix: clear  UTERUS: Neg CMT Adnexa:  clear no masses . PAP done Limited exam discomfort and large abd wall  Scant white creamy dc    ASSESSMENT AND PLAN:  Discussed the following assessment and plan:  Visit for preventive health examination - hcm update td  labs  - Plan: Basic metabolic panel, CBC with Differential/Platelet, Hepatic function panel, Lipid panel, TSH, HIV antibody, PAP [[place]]  Encounter for routine gynecological examination - pap sti screen today - Plan: Basic metabolic panel, CBC with Differential/Platelet, Hepatic function panel, Lipid panel, TSH, HIV antibody, PAP [[place]]  Hoarseness - ? from singing vs other no obv sinusitis reflux does do some throat clearing. ent chec - Plan: Ambulatory referral to ENT  Vaginal discharge - nl of bv?  basically nl exam otherwisecheck for bv etc screening   Need for tetanus booster - Plan: Td vaccine greater than or equal to 7yo preservative free IM Counseled regarding healthy nutrition, exercise, sleep, injury prevention, calcium vit d and healthy weight . preg sti prevent  Patient Care Team: Madelin HeadingsWanda K Panosh, MD as PCP -  General Patient Instructions  Continue lifestyle intervention healthy eating and exercise . Healthy lifestyle includes : At least 150 minutes of exercise weeks  , weight at healthy levels, which is usually   BMI 19-25. Avoid trans fats and processed foods;  Increase fresh fruits and veges to 5 servings per day. And avoid sweet beverages including tea and juice. Mediterranean diet with olive oil and nuts have been noted to be heart and brain healthy . Avoid tobacco products . Limit  alcohol to  7 per week for women and 14 servings for men.  Get adequate sleep . Wear seat belts . Don't text and drive .   Will notify you  of labs/pap  when available. Poss BV  If so then we can add antitbtionc Hoarseness can be caused by allergy voice strain , feflux and  throat clearing  Will arrange ENT to check your vocal chords . As to cause of the hoarseness.      Neta Mends. Panosh M.D.

## 2015-04-05 LAB — HIV ANTIBODY (ROUTINE TESTING W REFLEX): HIV 1&2 Ab, 4th Generation: NONREACTIVE

## 2015-04-06 LAB — CYTOLOGY - PAP

## 2015-04-07 LAB — CERVICOVAGINAL ANCILLARY ONLY: Candida vaginitis: NEGATIVE

## 2015-04-13 ENCOUNTER — Other Ambulatory Visit: Payer: Self-pay | Admitting: Family Medicine

## 2015-04-13 MED ORDER — METRONIDAZOLE 500 MG PO TABS: 500.0000 mg | ORAL_TABLET | Freq: Two times a day (BID) | ORAL | Status: DC

## 2015-06-01 ENCOUNTER — Ambulatory Visit (INDEPENDENT_AMBULATORY_CARE_PROVIDER_SITE_OTHER): Payer: Self-pay | Admitting: Internal Medicine

## 2015-06-01 ENCOUNTER — Encounter: Payer: Self-pay | Admitting: Family Medicine

## 2015-06-01 ENCOUNTER — Encounter: Payer: Self-pay | Admitting: Internal Medicine

## 2015-06-01 VITALS — BP 114/70 | Temp 98.5°F | Wt 220.3 lb

## 2015-06-01 DIAGNOSIS — J039 Acute tonsillitis, unspecified: Secondary | ICD-10-CM

## 2015-06-01 DIAGNOSIS — J029 Acute pharyngitis, unspecified: Secondary | ICD-10-CM

## 2015-06-01 LAB — POCT RAPID STREP A (OFFICE): RAPID STREP A SCREEN: NEGATIVE

## 2015-06-01 MED ORDER — AMOXICILLIN 500 MG PO CAPS: 500.0000 mg | ORAL_CAPSULE | Freq: Two times a day (BID) | ORAL | Status: DC

## 2015-06-01 NOTE — Patient Instructions (Addendum)
Looks like a throat infection in left tonsil. Because is localized this may be a bacterial infection .and may respond to antibiotic treatment   Waiting on the throat culture .  Results for further information for strep infection.  Continue  Gargles  Tylenol  Advil.  For pain   Expect improvement in the next 2-3 days either way . If turns into a cold then probably a viral infection and antibiotics no help.   Contact us if fever  Recurring sx worsening.

## 2015-06-01 NOTE — Progress Notes (Signed)
Pre visit review using our clinic review tool, if applicable. No additional management support is needed unless otherwise documented below in the visit note.  Chief Complaint  Patient presents with  . Sore Throat    HPI: Patient Alyssa Lloyd  comes in today for SDA for  new problem evaluation. St bad for 3 days  hursts to swallow and talk. No associated fever runny nose cough or new hoarseness. Is tender on the left side of her throat more than her right. Using Tylenol and some gargles. No history of recent strep exposure works in the [employer] system. No history of recurrent strep.  ROS: See pertinent positives and negatives per HPI. No fever or chills associated GI symptoms.  Past Medical History  Diagnosis Date  . HSV (herpes simplex virus) anogenital infection   . 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 PID (acute pelvic inflammatory disease) 01/11/2011  . HERPES GENITALIS 11/02/2010    Qualifier: Diagnosis of  By: Fabian Sharp MD, Neta Mends     Family History  Problem Relation Age of Onset  . Hypertension    . Gallbladder disease Mother   . Diabetes    . Asthma    . Anemia    . Allergies    . Migraines      History   Social History  . Marital Status: Single    Spouse Name: N/A  . Number of Children: N/A  . Years of Education: N/A   Social History Main Topics  . Smoking status: Never Smoker   . Smokeless tobacco: Never Used  . Alcohol Use: No  . Drug Use: No  . Sexual Activity: Not on file   Other Topics Concern  . None   Social History Narrative   ? Ets, in the past    Mom smoked and now has stopped this year.   Non smoker    Works [employer] recently separated and divorcing stressful.  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       Was  5 # 9 oz FT  gestation    Outpatient Prescriptions Prior to Visit  Medication Sig Dispense Refill  . metroNIDAZOLE (FLAGYL) 500 MG tablet Take 1 tablet (500 mg  total) by mouth 2 (two) times daily. 14 tablet 0   No facility-administered medications prior to visit.     EXAM:  BP 114/70 mmHg  Temp(Src) 98.5 F (36.9 C) (Oral)  Wt 220 lb 4.8 oz (99.927 kg)  Body mass index is 38.41 kg/(m^2).  GENERAL: vitals reviewed and listed above, alert, oriented, appears well hydrated and in no acute distress HEENT: atraumatic, conjunctiva  clear, no obvious abnormalities on inspection of external nose and ears TMs are intact OP : no lesion edema or exudate redness +2 last tonsillar area +1 right NECK: no obvious masses on inspection supple left +1 before meals tender JD G negative PC LUNGS: clear to auscultation bilaterally, no wheezes, rales or rhonchi, good air movement CV: HRRR, no clubbing cyanosis or  peripheral edema nl cap refill  MS: moves all extremities without noticeable focal  abnormality PSYCH: pleasant and cooperative, no obvious depression or anxiety  ASSESSMENT AND PLAN:  Discussed the following assessment and plan:  Sore throat - Plan: POC Rapid Strep A, Throat culture ([facility])  Tonsillitis left  - with adenopathy mild  suspect bacterial infection based on hx and context   strep cx pending Note for work to return tomorrow when  improved. Discussion. Expectant management at this point benefit more than risk of anabiotic use. -Patient advised to return or notify health care team  if symptoms worsen ,persist or new concerns arise.  Patient Instructions  Looks like a throat infection in left tonsil. Because is localized this may be a bacterial infection .and may respond to antibiotic treatment   Waiting on the throat culture .  Results for further information for strep infection.  Continue  Gargles  Tylenol  Advil.  For pain   Expect improvement in the next 2-3 days either way . If turns into a cold then probably a viral infection and antibiotics no help.   Contact us if fever  Recurring sx worsening.      Neta Mends. Panosh  M.D.

## 2015-06-03 LAB — CULTURE, GROUP A STREP: Organism ID, Bacteria: NORMAL

## 2016-07-16 ENCOUNTER — Encounter (HOSPITAL_COMMUNITY): Payer: Self-pay

## 2016-07-16 ENCOUNTER — Emergency Department (HOSPITAL_COMMUNITY)
Admission: EM | Admit: 2016-07-16 | Discharge: 2016-07-16 | Disposition: A | Discharge status: Home / Self Care | Payer: Self-pay | Attending: Emergency Medicine | Admitting: Emergency Medicine

## 2016-07-16 DIAGNOSIS — R110 Nausea: Secondary | ICD-10-CM | POA: Insufficient documentation

## 2016-07-16 DIAGNOSIS — R1084 Generalized abdominal pain: Secondary | ICD-10-CM | POA: Insufficient documentation

## 2016-07-16 DIAGNOSIS — Z791 Long term (current) use of non-steroidal anti-inflammatories (NSAID): Secondary | ICD-10-CM | POA: Insufficient documentation

## 2016-07-16 LAB — URINALYSIS, ROUTINE W REFLEX MICROSCOPIC
Bilirubin Urine: NEGATIVE
Glucose, UA: NEGATIVE mg/dL
Hgb urine dipstick: NEGATIVE
Ketones, ur: NEGATIVE mg/dL
Leukocytes, UA: NEGATIVE
Nitrite: NEGATIVE
Protein, ur: NEGATIVE mg/dL
Specific Gravity, Urine: 1.025 (ref 1.005–1.030)
pH: 6 (ref 5.0–8.0)

## 2016-07-16 LAB — COMPREHENSIVE METABOLIC PANEL
ALT: 22 U/L (ref 14–54)
AST: 24 U/L (ref 15–41)
Albumin: 4.2 g/dL (ref 3.5–5.0)
Alkaline Phosphatase: 72 U/L (ref 38–126)
Anion gap: 8 (ref 5–15)
BUN: 8 mg/dL (ref 6–20)
CO2: 25 mmol/L (ref 22–32)
Calcium: 9 mg/dL (ref 8.9–10.3)
Chloride: 103 mmol/L (ref 101–111)
Creatinine, Ser: 0.86 mg/dL (ref 0.44–1.00)
GFR calc Af Amer: >60 mL/min (ref >60)
GFR calc non Af Amer: >60 mL/min (ref >60)
Glucose, Bld: 90 mg/dL (ref 65–99)
Potassium: 3.4 mmol/L — ABNORMAL LOW (ref 3.5–5.1)
Sodium: 136 mmol/L (ref 135–145)
Total Bilirubin: 0.4 mg/dL (ref 0.3–1.2)
Total Protein: 8 g/dL (ref 6.5–8.1)

## 2016-07-16 LAB — CBC WITH DIFFERENTIAL/PLATELET
Basophils Absolute: 0 10*3/uL (ref 0.0–0.1)
Basophils Relative: 1 %
Eosinophils Absolute: 0.5 10*3/uL (ref 0.0–0.7)
Eosinophils Relative: 7 %
HCT: 35.9 % — ABNORMAL LOW (ref 36.0–46.0)
Hemoglobin: 11.8 g/dL — ABNORMAL LOW (ref 12.0–15.0)
Lymphocytes Relative: 31 %
Lymphs Abs: 2.3 10*3/uL (ref 0.7–4.0)
MCH: 25.8 pg — ABNORMAL LOW (ref 26.0–34.0)
MCHC: 32.9 g/dL (ref 30.0–36.0)
MCV: 78.4 fL (ref 78.0–100.0)
Monocytes Absolute: 0.5 10*3/uL (ref 0.1–1.0)
Monocytes Relative: 7 %
Neutro Abs: 4.1 10*3/uL (ref 1.7–7.7)
Neutrophils Relative %: 54 %
Platelets: 349 10*3/uL (ref 150–400)
RBC: 4.58 MIL/uL (ref 3.87–5.11)
RDW: 13.6 % (ref 11.5–15.5)
WBC: 7.5 10*3/uL (ref 4.0–10.5)

## 2016-07-16 LAB — PREGNANCY, URINE: Preg Test, Ur: NEGATIVE

## 2016-07-16 MED ORDER — DIPHENHYDRAMINE HCL 25 MG PO CAPS
25.0000 mg | ORAL_CAPSULE | Freq: Once | ORAL | Status: AC
  Administered 2016-07-16: 25 mg via ORAL
  Filled 2016-07-16: qty 1

## 2016-07-16 MED ORDER — ONDANSETRON HCL 4 MG/2ML IJ SOLN
4.0000 mg | Freq: Once | INTRAMUSCULAR | Status: AC
  Administered 2016-07-16: 4 mg via INTRAVENOUS
  Filled 2016-07-16: qty 2

## 2016-07-16 MED ORDER — ONDANSETRON HCL 4 MG PO TABS: 4.0000 mg | ORAL_TABLET | Freq: Four times a day (QID) | ORAL | 0 refills | Status: DC

## 2016-07-16 NOTE — Discharge Instructions (Signed)
Please read attached information. If you experience any new or worsening signs or symptoms please return to the emergency room for evaluation. Please follow-up with your primary care provider or specialist as discussed. Please use medication prescribed only as directed and discontinue taking if you have any concerning signs or symptoms.   °

## 2016-07-16 NOTE — ED Triage Notes (Signed)
Pt complains of generalized abdominal pain since Saturday, no vomiting or diarrhea but states she is nauseated

## 2016-07-16 NOTE — ED Provider Notes (Signed)
[facility]-EMERGENCY DEPT Provider Note   CSN: [id number] Arrival date & time: 07/16/16  0010  By signing my name below, I, Vista Minkobert Ross, attest that this documentation has been prepared under the direction and in the presence of H&R BlockJeffrey Daran Favaro PA-C.  Electronically Signed: Vista Minkobert Ross, ED Scribe. 07/16/16. 2:11 AM.   History   Chief Complaint Chief Complaint  Patient presents with  . Abdominal Pain    HPI HPI Comments: Arnaldo NatalMorrissia B Tiger is a 25 y.o. female with a PMHx of PID, who presents to the Emergency Department complaining of persistant abdominal pain and nausea for the past 3 days. She states that it "feels like knots in my stomach" and "feels like someone is twisting something in my stomach". Pt reports that she has had normal bowel movements. Pt has had decreased food intake because she states that it makes her nauseas. She states that she had flu symptoms recently such as body aches and chills. Pt is not currently sexual active, no chance that she is pregnant. She denies any fever.   The history is provided by the patient. No language interpreter was used.   Past Medical History:  Diagnosis Date  . 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 HERPES GENITALIS 11/02/2010   Qualifier: Diagnosis of  By: Fabian SharpPanosh MD, Neta MendsWanda K   . HSV (herpes simplex virus) anogenital infection   . PID (acute pelvic inflammatory disease) 01/11/2011    Patient Active Problem List   Diagnosis Date Noted  . Visit for preventive health examination 04/04/2015  . Encounter for routine gynecological examination 04/04/2015  . Hoarseness 04/04/2015  . Positive D dimer 11/28/2012  . Mild anemia 11/28/2012  . TMJ tenderness 03/30/2011  . Encounter for contraceptive management 01/22/2011  . Abdominal pain 01/08/2011  . IRREGULAR MENSES 08/24/2010  . HEMATURIA UNSPECIFIED 08/04/2010  . STRESS REACTION, ACUTE 07/04/2010  . HEADACHE 05/12/2008  . GOITER, UNSPECIFIED 09/11/2007  . G  E REFLUX 09/11/2007    History reviewed. No pertinent surgical history.  OB History    No data available       Home Medications    Prior to Admission medications   Medication Sig Start Date End Date Taking? Authorizing Provider  ibuprofen (ADVIL,MOTRIN) 200 MG tablet Take 600-800 mg by mouth every 6 (six) hours as needed (for headache).   Yes Historical Provider, MD  Pseudoephedrine-APAP-DM (DAYQUIL MULTI-SYMPTOM COLD/FLU PO) Take 2 capsules by mouth every 6 (six) hours as needed (for flu symptoms.).   Yes Historical Provider, MD  ondansetron (ZOFRAN) 4 MG tablet Take 1 tablet (4 mg total) by mouth every 6 (six) hours. 07/16/16   Eyvonne MechanicJeffrey Tyneka Scafidi, PA-C    Family History Family History  Problem Relation Age of Onset  . Hypertension    . Gallbladder disease Mother   . Diabetes    . Asthma    . Anemia    . Allergies    . Migraines      Social History Social History  Substance Use Topics  . Smoking status: Never Smoker  . Smokeless tobacco: Never Used  . Alcohol use No     Allergies   Review of patient's allergies indicates no known allergies.   Review of Systems Review of Systems  Constitutional: Positive for chills.  Gastrointestinal: Positive for abdominal pain and nausea.  All other systems reviewed and are negative.    Physical Exam Updated Vital Signs BP 128/82 (BP Location: Right Arm)   Pulse 63   Temp 98.1 F (36.7  C) (Oral)   Resp 18   Ht 5\' 3"  (1.6 m)   Wt 104.3 kg   LMP 07/01/2016   SpO2 100%   BMI 40.74 kg/m   Physical Exam  Constitutional: She is oriented to person, place, and time. She appears well-developed and well-nourished. No distress.  HENT:  Head: Normocephalic and atraumatic.  Neck: Normal range of motion.  Pulmonary/Chest: Effort normal.  Abdominal:  Generalized minor discomfort with deep palpation. No focal findings. Non surgical abdomen.   Neurological: She is alert and oriented to person, place, and time.  Skin: Skin is  warm and dry. She is not diaphoretic.  Psychiatric: She has a normal mood and affect. Judgment normal.  Nursing note and vitals reviewed.    ED Treatments / Results  DIAGNOSTIC STUDIES: Oxygen Saturation is 98% on RA, normal by my interpretation.  COORDINATION OF CARE: 1:33 AM-Will order blood work. Discussed treatment plan with pt at bedside and pt agreed to plan.    Labs (all labs ordered are listed, but only abnormal results are displayed) Labs Reviewed  CBC WITH DIFFERENTIAL/PLATELET - Abnormal; Notable for the following:       Result Value   Hemoglobin 11.8 (*)    HCT 35.9 (*)    MCH 25.8 (*)    All other components within normal limits  COMPREHENSIVE METABOLIC PANEL - Abnormal; Notable for the following:    Potassium 3.4 (*)    All other components within normal limits  URINALYSIS, ROUTINE W REFLEX MICROSCOPIC (NOT AT [facility])  PREGNANCY, URINE  POC URINE PREG, ED    EKG  EKG Interpretation None      Radiology No results found.  Procedures Procedures (including critical care time)  Medications Ordered in ED Medications  ondansetron (ZOFRAN) injection 4 mg (4 mg Intravenous Given 07/16/16 0240)  diphenhydrAMINE (BENADRYL) capsule 25 mg (25 mg Oral Given 07/16/16 0414)     Initial Impression / Assessment and Plan / ED Course  I have reviewed the triage vital signs and the nursing notes.  Pertinent labs & imaging results that were available during my care of the patient were reviewed by me and considered in my medical decision making (see chart for details).  Clinical Course    Final Clinical Impressions(s) / ED Diagnoses   Final diagnoses:  Nausea   Labs:UA, urine pregnant, CBC, CMP  Imaging:  Consults:  Therapeutics:  Discharge Meds:   Assessment/Plan:  24 red female presents today with complaints of nausea. Patient has very mild abdominal discomfort that comes and goes. She does not have any significant abdominal tenderness, no active  vomiting or diarrhea. She is afebrile nontoxic in no acute distress. Patient denies any other complaints, symptoms improved with antinausea medication here in the ED. Patient notes indicate history of Fitz-Hugh Curtis syndrome; patient denies any history of this, she does note that at one point she did have a chlamydia infection that was simply treated with oral antibiotics at home with no complications, no history of imaging.   At this time discharge patient noted a hive to her right lateral thigh. She denied any chest pain shortness of breath, swelling in mouth and throat. No other hives noted. She was given Benadryl, Gershon monitor for any new or worsening signs or symptoms. Patient is urged to follow-up with primary care provider for reevaluation, return to the emergency room immediately. Patient verbalized understanding and agreement to today's plan had no further questions or concerns at the time discharge  New Prescriptions Discharge Medication List as of 07/16/2016  3:56 AM    START taking these medications   Details  ondansetron (ZOFRAN) 4 MG tablet Take 1 tablet (4 mg total) by mouth every 6 (six) hours., Starting Tue 07/16/2016, Print      I personally performed the services described in this documentation, which was scribed in my presence. The recorded information has been reviewed and is accurate.     Eyvonne Mechanic, PA-C 07/16/16 4098    April Palumbo, MD 07/16/16 [phone]

## 2016-08-30 ENCOUNTER — Encounter (HOSPITAL_COMMUNITY): Payer: Self-pay | Admitting: Emergency Medicine

## 2016-08-30 ENCOUNTER — Emergency Department (HOSPITAL_COMMUNITY)
Admission: EM | Admit: 2016-08-30 | Discharge: 2016-08-31 | Disposition: A | Discharge status: Home / Self Care | Payer: Self-pay | Attending: Emergency Medicine | Admitting: Emergency Medicine

## 2016-08-30 DIAGNOSIS — B349 Viral infection, unspecified: Secondary | ICD-10-CM | POA: Insufficient documentation

## 2016-08-30 LAB — RAPID STREP SCREEN (MED CTR MEBANE ONLY): STREPTOCOCCUS, GROUP A SCREEN (DIRECT): NEGATIVE

## 2016-08-30 MED ORDER — BENZONATATE 100 MG PO CAPS
200.0000 mg | ORAL_CAPSULE | Freq: Once | ORAL | Status: AC
  Administered 2016-08-30: 200 mg via ORAL
  Filled 2016-08-30: qty 2

## 2016-08-30 MED ORDER — GI COCKTAIL ~~LOC~~
30.0000 mL | Freq: Once | ORAL | Status: AC
  Administered 2016-08-30: 30 mL via ORAL
  Filled 2016-08-30: qty 30

## 2016-08-30 MED ORDER — IBUPROFEN 800 MG PO TABS
800.0000 mg | ORAL_TABLET | Freq: Once | ORAL | Status: AC
  Administered 2016-08-30: 800 mg via ORAL
  Filled 2016-08-30: qty 1

## 2016-08-30 NOTE — ED Provider Notes (Addendum)
[facility]-EMER[facility]Y DEPT Provider Note   CSN: [id number] Arrival date & time: 08/30/16  2104  By signing my name below, I, Nelwyn SalisburyJoshua Fowler, attest that this documentation has been prepared under the direction and in the presence of Yannick Steuber, MD . Electronically Signed: Nelwyn SalisburyJoshua Fowler, Scribe. 08/30/2016. 11:03 PM.  History   Chief Complaint Chief Complaint  Patient presents with  . Hematemesis  . Cough  . Generalized Body Aches  . Sore Throat   Cough  This is a new problem. The current episode started more than 2 days ago. The problem occurs constantly (Waxing/Waning). The problem has not changed since onset.There has been no fever. Associated symptoms include sore throat and myalgias. Pertinent negatives include no rhinorrhea and no shortness of breath. She has tried decongestants and cough syrup for the symptoms. The treatment provided no relief.    HPI Comments:  Alyssa Lloyd is a 25 y.o. female with pmhx of PID who presents to the Emergency Department complaining of sudden-onset constant unchanged cough beginning 6 days ago. Pt states that she has tried dayquil, nyquil and robitussin for her pain with no relief. No aggravating factors indicated. She reports associated diffuse body aches, vomiting, and sore throat secondary to coughing. Pt denies any rhinorrhea.   Past Medical History:  Diagnosis Date  . 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 HERPES GENITALIS 11/02/2010   Qualifier: Diagnosis of  By: Fabian SharpPanosh MD, Neta MendsWanda K   . HSV (herpes simplex virus) anogenital infection   . PID (acute pelvic inflammatory disease) 01/11/2011    Patient Active Problem List   Diagnosis Date Noted  . Visit for preventive health examination 04/04/2015  . Encounter for routine gynecological examination 04/04/2015  . Hoarseness 04/04/2015  . Positive D dimer 11/28/2012  . Mild anemia 11/28/2012  . TMJ tenderness 03/30/2011  . Encounter for contraceptive management  01/22/2011  . Abdominal pain 01/08/2011  . IRREGULAR MENSES 08/24/2010  . HEMATURIA UNSPECIFIED 08/04/2010  . STRESS REACTION, ACUTE 07/04/2010  . HEADACHE 05/12/2008  . GOITER, UNSPECIFIED 09/11/2007  . G E REFLUX 09/11/2007    History reviewed. No pertinent surgical history.  OB History    No data available       Home Medications    Prior to Admission medications   Medication Sig Start Date End Date Taking? Authorizing Provider  ibuprofen (ADVIL,MOTRIN) 200 MG tablet Take 600-800 mg by mouth every 6 (six) hours as needed (for headache).    Historical Provider, MD  ondansetron (ZOFRAN) 4 MG tablet Take 1 tablet (4 mg total) by mouth every 6 (six) hours. 07/16/16   Tinnie GensJeffrey Hedges, PA-C  Pseudoephedrine-APAP-DM (DAYQUIL MULTI-SYMPTOM COLD/FLU PO) Take 2 capsules by mouth every 6 (six) hours as needed (for flu symptoms.).    Historical Provider, MD    Family History Family History  Problem Relation Age of Onset  . Hypertension    . Gallbladder disease Mother   . Diabetes    . Asthma    . Anemia    . Allergies    . Migraines      Social History Social History  Substance Use Topics  . Smoking status: Never Smoker  . Smokeless tobacco: Never Used  . Alcohol use No     Allergies   Review of patient's allergies indicates no known allergies.   Review of Systems Review of Systems  Constitutional: Negative for fever.  HENT: Positive for sore throat. Negative for rhinorrhea.   Respiratory: Positive for cough. Negative for shortness  of breath.   Musculoskeletal: Positive for myalgias. Negative for neck pain and neck stiffness.  All other systems reviewed and are negative.    Physical Exam Updated Vital Signs BP 156/91 (BP Location: Left Arm)   Pulse 104   Temp 99.5 F (37.5 C) (Oral)   Resp 20   Ht 5\' 3"  (1.6 m)   Wt 230 lb (104.3 kg)   SpO2 98%   BMI 40.74 kg/m   Physical Exam  Constitutional: She is oriented to person, place, and time. She appears  well-developed and well-nourished.  HENT:  Head: Normocephalic and atraumatic.  Mouth/Throat: Oropharynx is clear and moist. No oropharyngeal exudate.  Eyes: Conjunctivae and EOM are normal. Pupils are equal, round, and reactive to light.  Neck: Normal range of motion. Neck supple. No tracheal deviation present.  No bruit  Cardiovascular: Normal rate, regular rhythm, normal heart sounds and intact distal pulses.   Pulmonary/Chest: Effort normal and breath sounds normal. No stridor.  Abdominal: Soft. Bowel sounds are normal. There is no rebound and no guarding.  Musculoskeletal: Normal range of motion. She exhibits no tenderness or deformity.  Lymphadenopathy:    She has no cervical adenopathy.  Neurological: She is alert and oriented to person, place, and time. She has normal reflexes. She displays normal reflexes.  Skin: Skin is warm and dry. Capillary refill takes less than 2 seconds.  Psychiatric: She has a normal mood and affect.    ED Treatments / Results   Vitals:   08/30/16 2114  BP: 156/91  Pulse: 104  Resp: 20  Temp: 99.5 F (37.5 C)   Results for orders placed or performed during the hospital encounter of 08/30/16  Rapid strep screen  Result Value Ref Range   Streptococcus, Group A Screen (Direct) NEGATIVE NEGATIVE   No results found.   Procedures Procedures (including critical care time)   Final Clinical Impressions(s) / ED Diagnoses  Viral URI: Based on [facility] criteria no indication for further testing or treatment.  All questions answered to patient's parents satisfaction. Based on history and exam patient has been appropriately medically screened and emergency conditions excluded. Patient is stable for discharge at this time. Follow up with your PMD for recheck in 2 days and strict return precautions given.  New Prescriptions New Prescriptions   No medications on file    I personally performed the services described in this documentation, which was  scribed in my presence. The recorded information has been reviewed and is accurate.       Cy Blamer, MD 08/31/16 0020    Woodrow Dulski, MD 08/31/16 [phone]

## 2016-08-30 NOTE — ED Triage Notes (Addendum)
Pt from home with complaints of cough that began Sunday and emesis that began today. Pt states she had 2 episodes of emesis and the second one was "mostly bright red blood". Pt denies nausea at this time. Pt has generalized pain that she rates at 7/10. Pt states she had a fever on Monday, but did not take anything for it. Pt has a low grade fever of 99.5 and is actively coughing during assessment. Pt has clear lung sounds. Pt has been taking tussin DM and dayquill and nyquill but has taken nothing today. Pt states that when she coughs she feels lightheaded

## 2016-08-31 ENCOUNTER — Encounter (HOSPITAL_COMMUNITY): Payer: Self-pay | Admitting: Emergency Medicine

## 2016-08-31 MED ORDER — IBUPROFEN 800 MG PO TABS: 800.0000 mg | ORAL_TABLET | Freq: Three times a day (TID) | ORAL | 0 refills | Status: DC

## 2016-08-31 MED ORDER — BENZONATATE 100 MG PO CAPS: 100.0000 mg | ORAL_CAPSULE | Freq: Three times a day (TID) | ORAL | 0 refills | Status: DC

## 2016-08-31 MED ORDER — FLUTICASONE PROPIONATE 50 MCG/ACT NA SUSP: 2.0000 | Freq: Every day | NASAL | 0 refills | Status: DC

## 2016-09-02 LAB — CULTURE, GROUP A STREP (THRC)

## 2016-09-04 MED FILL — BENZONATATE 100 MG CAPSULE: 100 | 7 days supply | Qty: 21 | Fill #0

## 2016-09-04 MED FILL — FLUTICASONE PROP 50 MCG SPR: 50 | 30 days supply | Qty: 16 | Fill #0

## 2016-09-04 MED FILL — IBUPROFEN 800 MG TABLET: 800 | 7 days supply | Qty: 21 | Fill #0

## 2016-09-05 ENCOUNTER — Encounter (HOSPITAL_COMMUNITY): Payer: Self-pay | Admitting: *Deleted

## 2016-09-05 ENCOUNTER — Emergency Department (HOSPITAL_COMMUNITY)
Admission: EM | Admit: 2016-09-05 | Discharge: 2016-09-05 | Disposition: A | Discharge status: Home / Self Care | Payer: Self-pay | Attending: Emergency Medicine | Admitting: Emergency Medicine

## 2016-09-05 ENCOUNTER — Emergency Department (HOSPITAL_COMMUNITY): Payer: Self-pay

## 2016-09-05 DIAGNOSIS — J181 Lobar pneumonia, unspecified organism: Secondary | ICD-10-CM | POA: Insufficient documentation

## 2016-09-05 DIAGNOSIS — Z79899 Other long term (current) drug therapy: Secondary | ICD-10-CM | POA: Insufficient documentation

## 2016-09-05 DIAGNOSIS — J189 Pneumonia, unspecified organism: Secondary | ICD-10-CM

## 2016-09-05 MED ORDER — AZITHROMYCIN 250 MG PO TABS: ORAL_TABLET | ORAL | 0 refills | Status: DC

## 2016-09-05 MED ORDER — ALBUTEROL SULFATE HFA 108 (90 BASE) MCG/ACT IN AERS
1.0000 | INHALATION_SPRAY | RESPIRATORY_TRACT | Status: DC | PRN
  Administered 2016-09-05: 1 via RESPIRATORY_TRACT
  Filled 2016-09-05: qty 6.7

## 2016-09-05 MED ORDER — AZITHROMYCIN 250 MG PO TABS
500.0000 mg | ORAL_TABLET | Freq: Once | ORAL | Status: AC
  Administered 2016-09-05: 500 mg via ORAL
  Filled 2016-09-05: qty 2

## 2016-09-05 MED ORDER — ALBUTEROL SULFATE (2.5 MG/3ML) 0.083% IN NEBU
5.0000 mg | INHALATION_SOLUTION | Freq: Once | RESPIRATORY_TRACT | Status: AC
  Administered 2016-09-05: 5 mg via RESPIRATORY_TRACT
  Filled 2016-09-05: qty 6

## 2016-09-05 NOTE — ED Provider Notes (Signed)
[facility] DEPT Provider Note   CSN: [id number] Arrival date & time: 09/05/16  2109     History   Chief Complaint Chief Complaint  Patient presents with  . Cough  . Shortness of Breath    HPI Alyssa Lloyd is a 25 y.o. female who presents with cough. She states that over the past 2 weeks she has had URI-like symptoms with a productive cough. Reports associated chills. She states she has had nasal congestion, sore throat, and cough that has not gotten better with OTC meds or Tessalon. She was seen here on 10/27 for similar symptoms. D/c with Tessalon. Denies fevers, ear pain, rhinorrhea, SOB, wheezing, abdominal pain, N/V.  HPI  Past Medical History:  Diagnosis Date  . 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 HERPES GENITALIS 11/02/2010   Qualifier: Diagnosis of  By: Fabian SharpPanosh MD, Neta MendsWanda K   . HSV (herpes simplex virus) anogenital infection   . PID (acute pelvic inflammatory disease) 01/11/2011    Patient Active Problem List   Diagnosis Date Noted  . Visit for preventive health examination 04/04/2015  . Encounter for routine gynecological examination 04/04/2015  . Hoarseness 04/04/2015  . Positive D dimer 11/28/2012  . Mild anemia 11/28/2012  . TMJ tenderness 03/30/2011  . Encounter for contraceptive management 01/22/2011  . Abdominal pain 01/08/2011  . IRREGULAR MENSES 08/24/2010  . HEMATURIA UNSPECIFIED 08/04/2010  . STRESS REACTION, ACUTE 07/04/2010  . HEADACHE 05/12/2008  . GOITER, UNSPECIFIED 09/11/2007  . G E REFLUX 09/11/2007    History reviewed. No pertinent surgical history.  OB History    No data available       Home Medications    Prior to Admission medications   Medication Sig Start Date End Date Taking? Authorizing Provider  benzonatate (TESSALON) 100 MG capsule Take 1 capsule (100 mg total) by mouth every 8 (eight) hours. 08/31/16   April Palumbo, MD  fluticasone [facility](FLONASE) 50 MCG/ACT nasal spray Place 2 sprays  into both nostrils daily. 08/31/16   April Palumbo, MD  guaifenesin (ROBITUSSIN) 100 MG/5ML syrup Take 400 mg by mouth 3 (three) times daily as needed for cough.    Historical Provider, MD  ibuprofen (ADVIL,MOTRIN) 800 MG tablet Take 1 tablet (800 mg total) by mouth 3 (three) times daily. 08/31/16   April Palumbo, MD  ondansetron (ZOFRAN) 4 MG tablet Take 1 tablet (4 mg total) by mouth every 6 (six) hours. Patient not taking: Reported on 08/30/2016 07/16/16   Eyvonne MechanicJeffrey Hedges, PA-C  Pseudoephedrine-APAP-DM (DAYQUIL MULTI-SYMPTOM COLD/FLU PO) Take 2 capsules by mouth every 6 (six) hours as needed (for flu symptoms.).    Historical Provider, MD    Family History Family History  Problem Relation Age of Onset  . Hypertension    . Gallbladder disease Mother   . Diabetes    . Asthma    . Anemia    . Allergies    . Migraines      Social History Social History  Substance Use Topics  . Smoking status: Never Smoker  . Smokeless tobacco: Never Used  . Alcohol use No     Allergies   Review of patient's allergies indicates no known allergies.   Review of Systems Review of Systems  Constitutional: Positive for chills and fever.  HENT: Positive for congestion and sore throat. Negative for ear pain and rhinorrhea.   Respiratory: Positive for cough. Negative for shortness of breath and wheezing.   Cardiovascular: Positive for chest pain.  Gastrointestinal: Negative for abdominal  pain, nausea and vomiting.  All other systems reviewed and are negative.    Physical Exam Updated Vital Signs BP 124/84 (BP Location: Right Arm)   Pulse 90   Temp 98.5 F (36.9 C) (Oral)   Resp 18   LMP 08/05/2016   SpO2 100%   Physical Exam  Constitutional: She is oriented to person, place, and time. She appears well-developed and well-nourished. No distress.  Coughing profusely during exam  HENT:  Head: Normocephalic and atraumatic.  Eyes: Conjunctivae are normal. Pupils are equal, round, and reactive  to light. Right eye exhibits no discharge. Left eye exhibits no discharge. No scleral icterus.  Neck: Normal range of motion. Neck supple.  Cardiovascular: Normal rate and regular rhythm.  Exam reveals no gallop and no friction rub.   No murmur heard. Pulmonary/Chest: Effort normal and breath sounds normal. No respiratory distress. She has no wheezes. She has no rales. She exhibits no tenderness.  After receiving breathing treatment  Abdominal: Soft. She exhibits no distension. There is no tenderness.  Musculoskeletal: She exhibits no edema.  Neurological: She is alert and oriented to person, place, and time.  Skin: Skin is warm and dry.  Psychiatric: She has a normal mood and affect. Her behavior is normal.  Nursing note and vitals reviewed.    ED Treatments / Results  Labs (all labs ordered are listed, but only abnormal results are displayed) Labs Reviewed - No data to display  EKG  EKG Interpretation None       Radiology Dg Chest 2 View  Result Date: 09/05/2016 CLINICAL DATA:  Subacute onset of cough and shortness of breath. Generalized chest tightness. Initial encounter. EXAM: CHEST  2 VIEW COMPARISON:  Chest radiograph performed 11/12/2012, and CTA of the chest performed 11/13/2012 FINDINGS: The lungs are well-aerated. Minimal right midlung linear opacity may reflect atelectasis or mild pneumonia. There is no evidence of pleural effusion or pneumothorax. The heart is normal in size; the mediastinal contour is within normal limits. No acute osseous abnormalities are seen. IMPRESSION: Minimal right midlung linear opacity may reflect atelectasis or mild pneumonia. Electronically Signed   By: Roanna RaiderJeffery  Chang M.D.   On: 09/05/2016 21:50    Procedures Procedures (including critical care time)  Medications Ordered in ED Medications  albuterol (PROVENTIL) (2.5 MG/3ML) 0.083% nebulizer solution 5 mg (5 mg Nebulization Given 09/05/16 2129)     Initial Impression / Assessment and  Plan / ED Course  I have reviewed the triage vital signs and the nursing notes.  Pertinent labs & imaging results that were available during my care of the patient were reviewed by me and considered in my medical decision making (see chart for details).  Clinical Course   25 year old female presents with CAP. Patient is afebrile, not tachycardic or tachypneic, normotensive, and not hypoxic. CXR remarkable for right middle lobe PNA. Duoneb given and patient reported mild symptomatic relief. Rx for Azithromycin given along with inhaler. Patient is NAD, non-toxic, with stable VS. Patient is informed of clinical course, understands medical decision making process, and agrees with plan. Opportunity for questions provided and all questions answered. Return precautions given.   Final Clinical Impressions(s) / ED Diagnoses   Final diagnoses:  Community acquired pneumonia of right middle lobe of lung [facility](HCC)    New Prescriptions New Prescriptions   No medications on file     Bethel BornKelly Marie Quante Pettry, PA-C 09/07/16 1102    Laurence Spatesachel Morgan Little, MD 09/09/16 [phone]

## 2016-09-05 NOTE — ED Triage Notes (Signed)
Pt complains of cough and shortness of breath for the past 2 weeks. Pt was seen last week for same but states it has not gotten better. Pt has tried tessalon pearls with no relief.

## 2016-09-05 NOTE — ED Notes (Signed)
Patient d/c'd self care.  F/U and medications reviewed.  Patient verbalized understanding. 

## 2016-09-06 MED FILL — AZITHROMYCIN 250 MG TABLET: 250 | 4 days supply | Qty: 4 | Fill #0

## 2016-09-10 ENCOUNTER — Ambulatory Visit (INDEPENDENT_AMBULATORY_CARE_PROVIDER_SITE_OTHER): Payer: Self-pay | Admitting: Internal Medicine

## 2016-09-10 ENCOUNTER — Encounter: Payer: Self-pay | Admitting: Internal Medicine

## 2016-09-10 VITALS — BP 120/66 | HR 62 | Temp 98.0°F | Wt 233.4 lb

## 2016-09-10 DIAGNOSIS — Z23 Encounter for immunization: Secondary | ICD-10-CM

## 2016-09-10 DIAGNOSIS — J189 Pneumonia, unspecified organism: Secondary | ICD-10-CM

## 2016-09-10 DIAGNOSIS — J181 Lobar pneumonia, unspecified organism: Secondary | ICD-10-CM

## 2016-09-10 NOTE — Progress Notes (Signed)
Pre visit review using our clinic review tool, if applicable. No additional management support is needed unless otherwise documented below in the visit note.  Chief Complaint  Patient presents with  . Follow-up    HPI: Alyssa Lloyd 25 y.o.  Fu ed visit for fever and cough  Dx with PNA  Small right ml Placed on x pack and plan fu  Since 11 2   Just finished med   Cough still but doing better    Now dry cough  No more green phelgm  Fel ssob going up stairs but better  Neb in hosp helped   Given albuterol  " Living off of ricola menthol cough drops "   See ed visit with fever felt to be viral and then returned   Sob  And no cp .   Was coughin up green   No blood and now  .  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 ROS: See pertinent positives and negatives per HPI.  Past Medical History:  Diagnosis Date  . 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 HERPES GENITALIS 11/02/2010   Qualifier: Diagnosis of  By: Fabian SharpPanosh MD, Neta MendsWanda K   . HSV (herpes simplex virus) anogenital infection   . PID (acute pelvic inflammatory disease) 01/11/2011    Family History  Problem Relation Age of Onset  . Hypertension    . Gallbladder disease Mother   . Diabetes    . Asthma    . Anemia    . Allergies    . Migraines      Social History   Social History  . Marital status: Single    Spouse name: N/A  . Number of children: N/A  . Years of education: N/A   Social History Main Topics  . Smoking status: Never Smoker  . Smokeless tobacco: Never Used  . Alcohol use No  . Drug use: No  . Sexual activity: Not Asked   Other Topics Concern  . None   Social History Narrative   ? Ets, in the past    Mom smoked and now has stopped this year.   Non smoker    [employer]   Parents recently separated and divorcing stressful.   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       Was  5 # 9 oz FT  gestation    Outpatient Medications Prior to Visit  Medication Sig Dispense Refill  . ibuprofen (ADVIL,MOTRIN) 800 MG tablet Take 1 tablet (800 mg total) by mouth 3 (three) times daily. 21 tablet 0  . Pseudoephedrine-APAP-DM (DAYQUIL MULTI-SYMPTOM COLD/FLU PO) Take 2 capsules by mouth every 6 (six) hours as needed (for flu symptoms.).    Marland Kitchen. fluticasone (FLONASE) 50 MCG/ACT nasal spray Place 2 sprays into both nostrils daily. (Patient not taking: Reported on 09/10/2016) 16 g 0  . azithromycin (ZITHROMAX) 250 MG tablet Take once daily 4 each 0  . benzonatate (TESSALON) 100 MG capsule Take 1 capsule (100 mg total) by mouth every 8 (eight) hours. 21 capsule 0  . guaifenesin (ROBITUSSIN) 100 MG/5ML syrup Take 400 mg by mouth 3 (three) times daily as needed for cough.    . ondansetron (ZOFRAN) 4 MG tablet Take 1 tablet (4 mg total) by mouth every 6 (six) hours. (Patient not taking: Reported on  08/30/2016) 12 tablet 0   No facility-administered medications prior to visit.      EXAM:  BP 120/66 (BP Location: Right Arm, Patient Position: Sitting, Cuff Size: Large)   Pulse 62   Temp 98 F (36.7 C) (Oral)   Wt 233 lb 6.4 oz (105.9 kg)   LMP 08/05/2016   SpO2 98%   BMI 41.34 kg/m   Body mass index is 41.34 kg/m.  GENERAL: vitals reviewed and listed above, alert, oriented, appears well hydrated and in no acute distressocass dry cough  Has bag of cough drops  HEENT: atraumatic, conjunctiva  clear, no obvious abnormalities on inspection of external nose and ears OP : no lesion edema or exudate  NECK: no obvious masses on inspection palpation  LUNGS: clear to auscultation bilaterally, no wheezes, rales or rhonchi,  = air movement  CV: HRRR, no clubbing cyanosis or  peripheral edema nl cap refill  MS: moves all extremities without noticeable focal  abnormality PSYCH: pleasant and cooperative, no obvious depression or anxiety BP  Readings from Last 3 Encounters:  09/10/16 120/66  09/05/16 124/84  08/31/16 110/67   Wt Readings from Last 3 Encounters:  09/10/16 233 lb 6.4 oz (105.9 kg)  08/30/16 230 lb (104.3 kg)  07/16/16 230 lb (104.3 kg)  EXAM: CHEST  2 VIEW  COMPARISON:  Chest radiograph performed 11/12/2012, and CTA of the chest performed 11/13/2012  FINDINGS: The lungs are well-aerated. Minimal right midlung linear opacity may reflect atelectasis or mild pneumonia. There is no evidence of pleural effusion or pneumothorax.  The heart is normal in size; the mediastinal contour is within normal limits. No acute osseous abnormalities are seen.  IMPRESSION: Minimal right midlung linear opacity may reflect atelectasis or mild pneumonia.   Electronically Signed   By: Roanna RaiderJeffery  Chang M.D.   On: 09/05/2016 21:50   ASSESSMENT AND PLAN:  Discussed the following assessment and plan:  Pneumonia of right middle lobe due to infectious organism [facility](HCC)  Need for prophylactic vaccination and inoculation against influenza - Plan: Flu Vaccine QUAD 36+ mos PF IM (Fluarix & Fluzone Quad PF) Total visit 25mins > 50% spent counseling and coordinating care as indicated in above note and in instructions to patient .   Disc advisability flu vaccine    Given today  -Patient advised to return or notify health care team  if symptoms worsen ,persist or new concerns arise. Total visit 25mins > 50% spent counseling and coordinating care as indicated in above note and in instructions to patient .   Patient Instructions  Can use the inhaler if  Helps tightness in chest or cough .   by that is not  Curative  Just a treatment reliever..     Changing   Menthol  Or oil based cough drops   To sugar free candy to suppress the cough . To avoid throat irritation.   NO further antibiotic is indicated at this time  You may continue to have fatigue and cough for another week or so  But should get better.   If relapsing  symptoms  of fever  Cough increase again get back  With us for recheck.     Neta MendsWanda K. Panosh M.D.

## 2016-09-10 NOTE — Patient Instructions (Addendum)
Can use the inhaler if  Helps tightness in chest or cough .   by that is not  Curative  Just a treatment reliever..     Changing   Menthol  Or oil based cough drops   To sugar free candy to suppress the cough . To avoid throat irritation.   NO further antibiotic is indicated at this time  You may continue to have fatigue and cough for another week or so  But should get better.   If relapsing symptoms  of fever  Cough increase again get back  With us for recheck.

## 2016-11-12 ENCOUNTER — Ambulatory Visit (INDEPENDENT_AMBULATORY_CARE_PROVIDER_SITE_OTHER)
Admission: RE | Admit: 2016-11-12 | Discharge: 2016-11-12 | Disposition: A | Discharge status: Home / Self Care | Payer: Self-pay | Source: Ambulatory Visit | Attending: Internal Medicine | Admitting: Internal Medicine

## 2016-11-12 ENCOUNTER — Encounter: Payer: Self-pay | Admitting: Internal Medicine

## 2016-11-12 ENCOUNTER — Ambulatory Visit (INDEPENDENT_AMBULATORY_CARE_PROVIDER_SITE_OTHER): Payer: Self-pay | Admitting: Internal Medicine

## 2016-11-12 VITALS — BP 110/70 | HR 89 | Temp 98.3°F | Wt 237.0 lb

## 2016-11-12 DIAGNOSIS — R042 Hemoptysis: Secondary | ICD-10-CM

## 2016-11-12 DIAGNOSIS — R058 Other specified cough: Secondary | ICD-10-CM

## 2016-11-12 DIAGNOSIS — R05 Cough: Secondary | ICD-10-CM

## 2016-11-12 MED ORDER — DOXYCYCLINE HYCLATE 100 MG PO TABS: 100.0000 mg | ORAL_TABLET | Freq: Two times a day (BID) | ORAL | 0 refills | Status: DC

## 2016-11-12 NOTE — Progress Notes (Signed)
Pre visit review using our clinic review tool, if applicable. No additional management support is needed unless otherwise documented below in the visit note.  Chief Complaint  Patient presents with  . Cough  . Nasal Congestion  . Generalized Body Aches  . Shortness of Breath  . Hoarse  . Headache  . Chills    HPI: Alyssa Lloyd 26 y.o.  sda  Appt.  Last seen 11 17 for [prob rml pna  Now with  5 days of  Sx   Sneezing chest and hae cong gestion  Body aches .    ? If fever. Wheezing sx   Never went away.  Self rx  daquil and nuyquild rob  Ibuprof.  Allergy  Phlegm nasty green streaks   Of blood med red.  Once in a while. Had a nose bleed yesterday .      Very congested    Vomited x 1 no diarrhea  No unusual rash  .  ? Sob . Here with sib ROS: See pertinent positives and negatives per HPI. lmp just before x mas  Not pregnant  Past Medical History:  Diagnosis Date  . 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 HERPES GENITALIS 11/02/2010   Qualifier: Diagnosis of  By: Fabian SharpPanosh MD, Neta MendsWanda K   . HSV (herpes simplex virus) anogenital infection   . PID (acute pelvic inflammatory disease) 01/11/2011    Family History  Problem Relation Age of Onset  . Hypertension    . Gallbladder disease Mother   . Diabetes    . Asthma    . Anemia    . Allergies    . Migraines      Social History   Social History  . Marital status: Single    Spouse name: N/A  . Number of children: N/A  . Years of education: N/A   Social History Main Topics  . Smoking status: Never Smoker  . Smokeless tobacco: Never Used  . Alcohol use No  . Drug use: No  . Sexual activity: Not Asked   Other Topics Concern  . None   Social History Narrative   ? Ets, in the past    Mom smoked and now has stopped this year.   Non smoker    Works [employer]   Parents recently separated and divorcing stressful.  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       Was  5 # 9 oz FT  gestation     Outpatient Medications Prior to Visit  Medication Sig Dispense Refill  . fluticasone (FLONASE) 50 MCG/ACT nasal spray Place 2 sprays into both nostrils daily. (Patient not taking: Reported on 11/12/2016) 16 g 0  . ibuprofen (ADVIL,MOTRIN) 800 MG tablet Take 1 tablet (800 mg total) by mouth 3 (three) times daily. 21 tablet 0  . Pseudoephedrine-APAP-DM (DAYQUIL MULTI-SYMPTOM COLD/FLU PO) Take 2 capsules by mouth every 6 (six) hours as needed (for flu symptoms.).     No facility-administered medications prior to visit.      EXAM:  BP 110/70 (BP Location: Right Arm, Patient Position: Sitting, Cuff Size: Large)   Pulse 89   Temp 98.3 F (36.8 C) (Oral)   Wt 237 lb (107.5 kg)   LMP 10/24/2016 (Approximate)   SpO2 98%   BMI 41.98 kg/m   Body mass index is 41.98 kg/m. WDWN in NAD  quiet respirations; mioderatly  congested  somewhat hoarse. Non toxic . ocass cough bronchial  HEENT: Normocephalic ;atraumatic ,  Eyes;  PERRL, EOMs  Full, lids and conjunctiva clear,,Ears: no deformities, canals nl, TM landmarks normal, Nose: no deformity mucoid  discharget congested;face minimally tender Mouth : OP clear without lesion or edema . Neck: Supple without adenopathy or masses or bruits Chest:  Clear to A   without wheezes rales or rhonchi  Exam limited by large chest wall  Seems nl bs  CV:  S1-S2 no gallops or murmurs peripheral perfusion is normal Skin :nl perfusion and no acute rashes    ASSESSMENT AND PLAN:  Discussed the following assessment and plan:  Respiratory tract congestion with cough - Plan: DG Chest 2 View  Cough with hemoptysis - Plan: DG Chest 2 View Acute respiratory infection possibly viral in context of season however recent history of pneumonia blood in the mucus could be from secondary nosebleed but at risk. Get chest x-ray planned intervention depending on results. NKDA -Patient advised to return or notify health care team  if symptoms worsen ,persist or new concerns  arise.  Patient Instructions  This is a respiratory tract infection including your sinuses in your bronchial tubes. I don't hear pneumonia but because of your past history and severity of your symptoms have much to get a chest x-ray to check for pneumonia. The blood in your mucus could be from your nose irritation but we will add an antibiotic because of high risk of a bacterial infection. After I get the x-ray readings will send in antibiotic to your pharmacy. Either way you should be a lot better in a week. If you get high fevers increasing shortness of breath or worsening contact the health care team. Most respiratory tract infections get better on their own without any antibiotic. Try hot tea and honey over-the-counter Mucinex DM for cough comfort. These are not cures.    Neta Mends. Panosh M.D.  See result note sending in doxy x 7 days

## 2016-11-12 NOTE — Patient Instructions (Signed)
This is a respiratory tract infection including your sinuses in your bronchial tubes. I don't hear pneumonia but because of your past history and severity of your symptoms have much to get a chest x-ray to check for pneumonia. The blood in your mucus could be from your nose irritation but we will add an antibiotic because of high risk of a bacterial infection. After I get the x-ray readings will send in antibiotic to your pharmacy. Either way you should be a lot better in a week. If you get high fevers increasing shortness of breath or worsening contact the health care team. Most respiratory tract infections get better on their own without any antibiotic. Try hot tea and honey over-the-counter Mucinex DM for cough comfort. These are not cures.

## 2016-11-13 ENCOUNTER — Encounter: Payer: Self-pay | Admitting: Family Medicine

## 2016-11-13 MED FILL — DOXYCYCLINE HYCLATE 100 MG: 100 | 7 days supply | Qty: 14 | Fill #0

## 2016-11-15 ENCOUNTER — Telehealth: Payer: Self-pay | Admitting: Internal Medicine

## 2016-11-15 MED ORDER — AZITHROMYCIN 250 MG PO TABS: ORAL_TABLET | ORAL | 0 refills | Status: DC

## 2016-11-15 MED FILL — AZITHROMYCIN 250 MG TABLET: 250 | 5 days supply | Qty: 6 | Fill #0

## 2016-11-15 NOTE — Telephone Encounter (Signed)
° ° ° °  Pt call to say the below med has caused her to break out in hives  doxycycline (VIBRA-TABS) 100 MG tablet    She is asking if something else can be called in and would like a call back    [facility]

## 2016-11-15 NOTE — Telephone Encounter (Signed)
Spoke to the pt.  She states the rash is on her chest, arms and back.  Is itching in her hairline.  She describes the rash as big bumps.  Like big bug bites all over.  Very itchy.  No fever or new sx.  Has not had a rash like this before.  Notified to stop the doxy and take some Benadryl to help with the itch.  Per WP, okay to send in a z-pak.  Doxy added to allergy list.  Pt notified to pick up rx at the pharmacy.

## 2016-12-03 ENCOUNTER — Emergency Department (HOSPITAL_COMMUNITY)
Admission: EM | Admit: 2016-12-03 | Discharge: 2016-12-03 | Disposition: A | Discharge status: Home / Self Care | Payer: Self-pay | Attending: Emergency Medicine | Admitting: Emergency Medicine

## 2016-12-03 DIAGNOSIS — H66002 Acute suppurative otitis media without spontaneous rupture of ear drum, left ear: Secondary | ICD-10-CM | POA: Insufficient documentation

## 2016-12-03 DIAGNOSIS — Z79899 Other long term (current) drug therapy: Secondary | ICD-10-CM | POA: Insufficient documentation

## 2016-12-03 MED ORDER — IBUPROFEN 800 MG PO TABS: 800.0000 mg | ORAL_TABLET | Freq: Three times a day (TID) | ORAL | 0 refills | Status: DC

## 2016-12-03 MED ORDER — AMOXICILLIN 500 MG PO CAPS
1000.0000 mg | ORAL_CAPSULE | Freq: Once | ORAL | Status: AC
  Administered 2016-12-03: 1000 mg via ORAL
  Filled 2016-12-03: qty 2

## 2016-12-03 MED ORDER — IBUPROFEN 800 MG PO TABS
800.0000 mg | ORAL_TABLET | Freq: Once | ORAL | Status: AC
  Administered 2016-12-03: 800 mg via ORAL
  Filled 2016-12-03: qty 1

## 2016-12-03 MED ORDER — AMOXICILLIN 500 MG PO CAPS: 1000.0000 mg | ORAL_CAPSULE | Freq: Two times a day (BID) | ORAL | 0 refills | Status: DC

## 2016-12-03 MED ORDER — HYDROCODONE-ACETAMINOPHEN 5-325 MG PO TABS
1.0000 | ORAL_TABLET | Freq: Once | ORAL | Status: AC
  Administered 2016-12-03: 1 via ORAL
  Filled 2016-12-03: qty 1

## 2016-12-03 NOTE — ED Triage Notes (Signed)
Pt states that she has been recovering from bronchitis and woke up tonight with bilateral ear pain. Alert and oriented.

## 2016-12-03 NOTE — ED Notes (Signed)
Patient was alert, oriented and stable upon discharge. RN went over AVS and patient had no further questions.  Patient was instructed not to drive or operate heavy machinery on narcotic pain medication.   

## 2016-12-03 NOTE — ED Notes (Signed)
Bed: WTR6 Expected date:  Expected time:  Means of arrival:  Comments: 

## 2016-12-09 NOTE — ED Provider Notes (Signed)
[facility]-EMERGENCY DEPT Provider Note   CSN: [id number] Arrival date & time: 12/03/16  0207     History   Chief Complaint Chief Complaint  Patient presents with  . Otalgia    HPI Alyssa Lloyd is a 26 y.o. female.  Patient presents with complaint of pain in both of her ears. She has had bronchitis for the past several weeks, completed Z-Pack 10 or 11 days ago and cough is subsiding. No fever, nausea, vomiting. No hearing loss or drainage from the ears. No headache.   The history is provided by the patient and a parent. No language interpreter was used.  Otalgia  Associated symptoms include cough. Pertinent negatives include no ear discharge and no vomiting.    Past Medical History:  Diagnosis Date  . 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 HERPES GENITALIS 11/02/2010   Qualifier: Diagnosis of  By: Fabian SharpPanosh MD, Neta MendsWanda K   . HSV (herpes simplex virus) anogenital infection   . PID (acute pelvic inflammatory disease) 01/11/2011    Patient Active Problem List   Diagnosis Date Noted  . Visit for preventive health examination 04/04/2015  . Encounter for routine gynecological examination 04/04/2015  . Hoarseness 04/04/2015  . Positive D dimer 11/28/2012  . Mild anemia 11/28/2012  . TMJ tenderness 03/30/2011  . Encounter for contraceptive management 01/22/2011  . Abdominal pain 01/08/2011  . IRREGULAR MENSES 08/24/2010  . HEMATURIA UNSPECIFIED 08/04/2010  . STRESS REACTION, ACUTE 07/04/2010  . HEADACHE 05/12/2008  . GOITER, UNSPECIFIED 09/11/2007  . G E REFLUX 09/11/2007    No past surgical history on file.  OB History    No data available       Home Medications    Prior to Admission medications   Medication Sig Start Date End Date Taking? Authorizing Provider  amoxicillin (AMOXIL) 500 MG capsule Take 2 capsules (1,000 mg total) by mouth 2 (two) times daily. 12/03/16   Elpidio AnisShari Quamir Willemsen, PA-C  azithromycin (ZITHROMAX Z-PAK) 250 MG tablet  TAKE AS DIRECTED 11/15/16   Madelin HeadingsWanda K Panosh, MD  fluticasone (FLONASE) 50 MCG/ACT nasal spray Place 2 sprays into both nostrils daily. Patient not taking: Reported on 11/12/2016 08/31/16   April Palumbo, MD  ibuprofen (ADVIL,MOTRIN) 800 MG tablet Take 1 tablet (800 mg total) by mouth 3 (three) times daily. 12/03/16   Mindy Gali, PA-C  Pseudoephedrine-APAP-DM (DAYQUIL MULTI-SYMPTOM COLD/FLU PO) Take 2 capsules by mouth every 6 (six) hours as needed (for flu symptoms.).    Historical Provider, MD    Family History Family History  Problem Relation Age of Onset  . Hypertension    . Gallbladder disease Mother   . Diabetes    . Asthma    . Anemia    . Allergies    . Migraines      Social History Social History  Substance Use Topics  . Smoking status: Never Smoker  . Smokeless tobacco: Never Used  . Alcohol use No     Allergies   Doxycycline   Review of Systems Review of Systems  Constitutional: Negative for appetite change and fever.  HENT: Positive for ear pain. Negative for ear discharge.   Respiratory: Positive for cough.   Cardiovascular: Negative for chest pain.  Gastrointestinal: Negative for nausea and vomiting.  Musculoskeletal: Negative for myalgias.  Neurological: Negative for dizziness.     Physical Exam Updated Vital Signs BP 147/98 (BP Location: Left Arm)   Pulse 69   Temp 98.5 F (36.9 C) (Oral)   Resp  18   LMP 11/25/2016 (Exact Date)   SpO2 98%   Physical Exam  Constitutional: She appears well-developed and well-nourished.  HENT:  Head: Normocephalic.  Right Ear: Tympanic membrane normal.  Left Ear: Tympanic membrane is erythematous. Tympanic membrane is not bulging. A middle ear effusion is present.  Neck: Normal range of motion. Neck supple.  Cardiovascular: Normal rate and regular rhythm.   Pulmonary/Chest: Effort normal and breath sounds normal.  Abdominal: Soft. Bowel sounds are normal. There is no tenderness. There is no rebound and no  guarding.  Musculoskeletal: Normal range of motion.  Neurological: She is alert. No cranial nerve deficit.  Skin: Skin is warm and dry. No rash noted.  Psychiatric: She has a normal mood and affect.     ED Treatments / Results  Labs (all labs ordered are listed, but only abnormal results are displayed) Labs Reviewed - No data to display  EKG  EKG Interpretation None       Radiology No results found.  Procedures Procedures (including critical care time)  Medications Ordered in ED Medications  amoxicillin (AMOXIL) capsule 1,000 mg (1,000 mg Oral Given 12/03/16 0250)  HYDROcodone-acetaminophen (NORCO/VICODIN) 5-325 MG per tablet 1 tablet (1 tablet Oral Given 12/03/16 0251)  ibuprofen (ADVIL,MOTRIN) tablet 800 mg (800 mg Oral Given 12/03/16 0250)     Initial Impression / Assessment and Plan / ED Course  I have reviewed the triage vital signs and the nursing notes.  Pertinent labs & imaging results that were available during my care of the patient were reviewed by me and considered in my medical decision making (see chart for details).     Patient with bilateral ear pain found to have left TM concerning for infection. Will start Amoxil and refer to PCP for recheck.  Final Clinical Impressions(s) / ED Diagnoses   Final diagnoses:  Acute suppurative otitis media of left ear without spontaneous rupture of tympanic membrane, recurrence not specified    New Prescriptions Discharge Medication List as of 12/03/2016  2:44 AM    START taking these medications   Details  amoxicillin (AMOXIL) 500 MG capsule Take 2 capsules (1,000 mg total) by mouth 2 (two) times daily., Starting Tue 12/03/2016, Print         Elpidio Anis, PA-C 12/09/16 2126    Gilda Crease, MD 12/15/16 [phone]

## 2017-08-17 ENCOUNTER — Emergency Department (HOSPITAL_COMMUNITY)
Admission: EM | Admit: 2017-08-17 | Discharge: 2017-08-17 | Disposition: A | Discharge status: Home / Self Care | Payer: Self-pay | Attending: Emergency Medicine | Admitting: Emergency Medicine

## 2017-08-17 ENCOUNTER — Encounter (HOSPITAL_COMMUNITY): Payer: Self-pay

## 2017-08-17 DIAGNOSIS — Z79899 Other long term (current) drug therapy: Secondary | ICD-10-CM | POA: Insufficient documentation

## 2017-08-17 DIAGNOSIS — R110 Nausea: Secondary | ICD-10-CM | POA: Insufficient documentation

## 2017-08-17 DIAGNOSIS — M791 Myalgia, unspecified site: Secondary | ICD-10-CM | POA: Insufficient documentation

## 2017-08-17 DIAGNOSIS — R51 Headache: Secondary | ICD-10-CM | POA: Insufficient documentation

## 2017-08-17 DIAGNOSIS — H1032 Unspecified acute conjunctivitis, left eye: Secondary | ICD-10-CM | POA: Insufficient documentation

## 2017-08-17 MED ORDER — ERYTHROMYCIN 5 MG/GM OP OINT
TOPICAL_OINTMENT | Freq: Once | OPHTHALMIC | Status: AC
  Administered 2017-08-17: 1 via OPHTHALMIC
  Filled 2017-08-17: qty 3.5

## 2017-08-17 MED ORDER — ONDANSETRON HCL 4 MG PO TABS: 4.0000 mg | ORAL_TABLET | Freq: Four times a day (QID) | ORAL | 0 refills | Status: DC

## 2017-08-17 MED ORDER — FLUORESCEIN SODIUM 1 MG OP STRP
1.0000 | ORAL_STRIP | Freq: Once | OPHTHALMIC | Status: AC
  Administered 2017-08-17: 1 via OPHTHALMIC
  Filled 2017-08-17: qty 1

## 2017-08-17 MED ORDER — ONDANSETRON 4 MG PO TBDP
4.0000 mg | ORAL_TABLET | Freq: Once | ORAL | Status: AC
  Administered 2017-08-17: 4 mg via ORAL
  Filled 2017-08-17: qty 1

## 2017-08-17 MED ORDER — TETRACAINE HCL 0.5 % OP SOLN
1.0000 [drp] | Freq: Once | OPHTHALMIC | Status: AC
  Administered 2017-08-17: 1 [drp] via OPHTHALMIC
  Filled 2017-08-17: qty 4

## 2017-08-17 NOTE — Discharge Instructions (Signed)
Use the eye ointment 3 times a day for the next 7 days. Follow up with Dr. Dione Booze if symptoms are not improving after 24 hours.  Follow up with your doctor for the nausea and body aches if they continue.  Return here as needed.

## 2017-08-17 NOTE — ED Triage Notes (Signed)
Patient c/o nausea and left eye drainage since waking this AM. Patient states her left eye was "stuck" with drainage.

## 2017-08-17 NOTE — ED Provider Notes (Signed)
[facility]-EMERGENCY DEPT Provider Note   CSN: [id number] Arrival date & time: 08/17/17  1117     History   Chief Complaint Chief Complaint  Patient presents with  . Nausea  . Eye Drainage    HPI Alyssa Lloyd is a 26 y.o. female who presents to the ED with left eye itching and draining. Patient reports yesterday she had body aches and itchy eyes and when she got up this morning her left eye was matted shut. Patient also c/o nausea today but no vomiting or diarrhea.   The history is provided by the patient. No language interpreter was used.  Conjunctivitis  The current episode started 12 to 24 hours ago. The problem occurs constantly. The problem has been gradually worsening. Associated symptoms include headaches. Pertinent negatives include no chest pain, no abdominal pain and no shortness of breath. She has tried nothing for the symptoms.    Past Medical History:  Diagnosis Date  . 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 HERPES GENITALIS 11/02/2010   Qualifier: Diagnosis of  By: Fabian Sharp MD, Neta Mends   . HSV (herpes simplex virus) anogenital infection   . PID (acute pelvic inflammatory disease) 01/11/2011    Patient Active Problem List   Diagnosis Date Noted  . Visit for preventive health examination 04/04/2015  . Encounter for routine gynecological examination 04/04/2015  . Hoarseness 04/04/2015  . Positive D dimer 11/28/2012  . Mild anemia 11/28/2012  . TMJ tenderness 03/30/2011  . Encounter for contraceptive management 01/22/2011  . Abdominal pain 01/08/2011  . IRREGULAR MENSES 08/24/2010  . HEMATURIA UNSPECIFIED 08/04/2010  . STRESS REACTION, ACUTE 07/04/2010  . HEADACHE 05/12/2008  . GOITER, UNSPECIFIED 09/11/2007  . G E REFLUX 09/11/2007    History reviewed. No pertinent surgical history.  OB History    No data available       Home Medications    Prior to Admission medications   Medication Sig Start Date End Date Taking?  Authorizing Provider  amoxicillin (AMOXIL) 500 MG capsule Take 2 capsules (1,000 mg total) by mouth 2 (two) times daily. 12/03/16   Elpidio Anis, PA-C  azithromycin (ZITHROMAX Z-PAK) 250 MG tablet TAKE AS DIRECTED 11/15/16   Panosh, Neta Mends, MD  fluticasone (FLONASE) 50 MCG/ACT nasal spray Place 2 sprays into both nostrils daily. Patient not taking: Reported on 11/12/2016 08/31/16   Palumbo, April, MD  ibuprofen (ADVIL,MOTRIN) 800 MG tablet Take 1 tablet (800 mg total) by mouth 3 (three) times daily. 12/03/16   Elpidio Anis, PA-C  ondansetron (ZOFRAN) 4 MG tablet Take 1 tablet (4 mg total) by mouth every 6 (six) hours. 08/17/17   Janne Napoleon, NP  Pseudoephedrine-APAP-DM (DAYQUIL MULTI-SYMPTOM COLD/FLU PO) Take 2 capsules by mouth every 6 (six) hours as needed (for flu symptoms.).    [provider]    Family History Family History  Problem Relation Age of Onset  . Hypertension Unknown   . Gallbladder disease Mother   . Diabetes Unknown   . Asthma Unknown   . Anemia Unknown   . Allergies Unknown   . Migraines Unknown     Social History Social History  Substance Use Topics  . Smoking status: Never Smoker  . Smokeless tobacco: Never Used  . Alcohol use No     Allergies   Doxycycline   Review of Systems Review of Systems  Constitutional: Negative for chills and fever.  HENT: Negative for congestion, ear pain, facial swelling and sore throat.   Eyes: Positive  for photophobia, discharge, redness and itching. Negative for visual disturbance.  Respiratory: Negative for cough and shortness of breath.   Cardiovascular: Negative for chest pain.  Gastrointestinal: Positive for nausea. Negative for abdominal pain, diarrhea and vomiting.  Genitourinary: Negative for dysuria, frequency and urgency.  Musculoskeletal: Positive for myalgias.  Skin: Negative for rash.  Neurological: Positive for headaches. Negative for syncope.  Hematological: Negative for adenopathy.    Psychiatric/Behavioral: Negative for confusion.     Physical Exam Updated Vital Signs BP (!) 130/94 (BP Location: Left Arm)   Pulse 76   Temp 98.3 F (36.8 C) (Oral)   Resp 16   LMP 08/10/2017 (Approximate)   SpO2 99%   Physical Exam  Constitutional: She appears well-developed and well-nourished. No distress.  HENT:  Head: Normocephalic.  Right Ear: Tympanic membrane normal.  Left Ear: Tympanic membrane normal.  Mouth/Throat: Uvula is midline, oropharynx is clear and moist and mucous membranes are normal.  Eyes: Pupils are equal, round, and reactive to light. EOM are normal. Lids are everted and swept, no foreign bodies found. Left eye exhibits discharge. Left conjunctiva is injected.  Slit lamp exam:      The left eye shows no corneal abrasion, no corneal ulcer, no foreign body and no fluorescein uptake.  Neck: Normal range of motion. Neck supple.  Cardiovascular: Normal rate and regular rhythm.   Pulmonary/Chest: Effort normal.  Abdominal: Soft. There is no tenderness.  Musculoskeletal: Normal range of motion.  Lymphadenopathy:    She has no cervical adenopathy.  Neurological: She is alert.  Skin: Skin is warm and dry.  Psychiatric: She has a normal mood and affect. Her behavior is normal.  Nursing note and vitals reviewed.    ED Treatments / Results  Labs (all labs ordered are listed, but only abnormal results are displayed) Labs Reviewed - No data to display  Radiology No results found.  Procedures Procedures (including critical care time)  Medications Ordered in ED Medications  fluorescein ophthalmic strip 1 strip (1 strip Left Eye Given 08/17/17 1513)  tetracaine (PONTOCAINE) 0.5 % ophthalmic solution 1 drop (1 drop Left Eye Given 08/17/17 1514)  ondansetron (ZOFRAN-ODT) disintegrating tablet 4 mg (4 mg Oral Given 08/17/17 1526)  erythromycin ophthalmic ointment (1 application Left Eye Given 08/17/17 1617)     Initial Impression / Assessment and Plan  / ED Course  I have reviewed the triage vital signs and the nursing notes.  Alyssa Lloyd presents with symptoms consistent with bacterial conjunctivitis.  Purulent discharge exam.  No corneal abrasions, entrapment, consensual photophobia, or dendritic staining with fluorescein study.  Presentation non-concerning for iritis, corneal abrasions, or HSV.  No evidence of preseptal or orbital cellulitis.  Pt is not a contact lens wearer.  Patient will be given erythromycin ophthalmic.  Personal hygiene and frequent handwashing discussed.  Patient advised to followup with ophthalmologist for reevaluation in several days..  Patient verbalizes understanding and is agreeable with discharge. First dose of erythromycin instilled prior to d/c.  Patient's nausea improved with Zofran.    Final Clinical Impressions(s) / ED Diagnoses   Final diagnoses:  Acute bacterial conjunctivitis of left eye  Nausea    New Prescriptions Discharge Medication List as of 08/17/2017  3:42 PM    START taking these medications   Details  ondansetron (ZOFRAN) 4 MG tablet Take 1 tablet (4 mg total) by mouth every 6 (six) hours., Starting Sun 08/17/2017, Print         Damian Leavell, [place], NP 08/17/17 2209  Raeford Razor, MD 08/18/17 1246

## 2018-09-02 ENCOUNTER — Emergency Department (HOSPITAL_COMMUNITY)
Admission: EM | Admit: 2018-09-02 | Discharge: 2018-09-02 | Disposition: A | Discharge status: Home / Self Care | Payer: Self-pay | Attending: Emergency Medicine | Admitting: Emergency Medicine

## 2018-09-02 ENCOUNTER — Encounter (HOSPITAL_COMMUNITY): Payer: Self-pay

## 2018-09-02 ENCOUNTER — Other Ambulatory Visit: Payer: Self-pay

## 2018-09-02 DIAGNOSIS — Y939 Activity, unspecified: Secondary | ICD-10-CM | POA: Insufficient documentation

## 2018-09-02 DIAGNOSIS — Y999 Unspecified external cause status: Secondary | ICD-10-CM | POA: Insufficient documentation

## 2018-09-02 DIAGNOSIS — S39012A Strain of muscle, fascia and tendon of lower back, initial encounter: Secondary | ICD-10-CM | POA: Insufficient documentation

## 2018-09-02 DIAGNOSIS — Y92410 Unspecified street and highway as the place of occurrence of the external cause: Secondary | ICD-10-CM | POA: Insufficient documentation

## 2018-09-02 MED ORDER — IBUPROFEN 800 MG PO TABS: 800.0000 mg | ORAL_TABLET | Freq: Three times a day (TID) | ORAL | 0 refills | Status: DC | PRN

## 2018-09-02 MED ORDER — METHOCARBAMOL 500 MG PO TABS: 500.0000 mg | ORAL_TABLET | Freq: Two times a day (BID) | ORAL | 0 refills | Status: DC

## 2018-09-02 NOTE — Discharge Instructions (Signed)
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
## 2019-02-16 NOTE — Progress Notes (Signed)
Virtual Visit via Video Note  I connected with@ on 02/17/19 at 12:00 PM EDT by a video enabled telemedicine application and verified that I am speaking with the correct person using two identifiers. Location patient: home Location provider:work or home office Persons participating in the virtual visit: patient, provider  [place] national recommendations  regarding COVID 19 pandemic   video visit is advised over in office visit for this patient.  Discussed the limitations of evaluation and management by telemedicine and  availability of in person appointments. The patient expressed understanding and agreed to proceed.   HPI: Alyssa Lloyd  Presents for video visit today  For  2 days or left red eye with itching   Having allergy sx  And  Taking zyrtec .    No fever  vision changes  Eye pain  Or  [name] awaoke with dc on eyue some crusting but not today and hasn't been to sleep yet   No contacts .  Eye is quite itchy at times    Laid off from job this months   From covid 19   ROS: See pertinent positives and negatives per HPI.  Past Medical History:  Diagnosis Date  . 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 HERPES GENITALIS 11/02/2010   Qualifier: Diagnosis of  By: Fabian Sharp MD, Neta Mends   . HSV (herpes simplex virus) anogenital infection   . PID (acute pelvic inflammatory disease) 01/11/2011    History reviewed. No pertinent surgical history.  Family History  Problem Relation Age of Onset  . Hypertension Unknown   . Gallbladder disease Mother   . Diabetes Unknown   . Asthma Unknown   . Anemia Unknown   . Allergies Unknown   . Migraines Unknown     SOCIAL HX:    Current Outpatient Medications:  .  amoxicillin (AMOXIL) 500 MG capsule, Take 2 capsules (1,000 mg total) by mouth 2 (two) times daily., Disp: 40 capsule, Rfl: 0 .  azithromycin (ZITHROMAX Z-PAK) 250 MG tablet, TAKE AS DIRECTED, Disp: 6 each, Rfl: 0 .  fluticasone (FLONASE)  50 MCG/ACT nasal spray, Place 2 sprays into both nostrils daily. (Patient not taking: Reported on 11/12/2016), Disp: 16 g, Rfl: 0 .  ibuprofen (ADVIL,MOTRIN) 800 MG tablet, Take 1 tablet (800 mg total) by mouth every 8 (eight) hours as needed for moderate pain., Disp: 21 tablet, Rfl: 0 .  methocarbamol (ROBAXIN) 500 MG tablet, Take 1 tablet (500 mg total) by mouth 2 (two) times daily., Disp: 20 tablet, Rfl: 0 .  ondansetron (ZOFRAN) 4 MG tablet, Take 1 tablet (4 mg total) by mouth every 6 (six) hours., Disp: 12 tablet, Rfl: 0 .  Pseudoephedrine-APAP-DM (DAYQUIL MULTI-SYMPTOM COLD/FLU PO), Take 2 capsules by mouth every 6 (six) hours as needed (for flu symptoms.)., Disp: , Rfl:   EXAM:  VITALS per patient if applicable:  GENERAL: alert, oriented, appears well and in no acute distress  HEENT: atraumatic, conjunttiva left shows mild mid latera conj injection without blepharitis      no obvious dx at this time  abnormalities on inspection of external nose and ears  NECK: normal movements of the head and neck  LUNGS: on inspection no signs of respiratory distress, breathing rate appears normal, no obvious gross SOB, gasping or wheezing no pobv congestion   CV: no obvious cyanosis  MS: moves all visible extremities without noticeable abnormality  PSYCH/NEURO: pleasant and cooperative, no obvious depression or anxiety, speech and thought processing grossly  intact  ASSESSMENT AND PLAN:  Discussed the following assessment and plan:  Allergic conjunctivitis of left eye Suspect allergic conjunctivitis left mild  With hx  And context .   Advise compresses  Add flonase  For ur allergies and  Try otc antihistamine zatidor or such . If  Acting infected   Reviewed then contact us e mail or  My chart or phone  For consideration of adding  Antibiotic drops not indicated at this time    Expectant management and discussion of plan and treatment with patient with opportunity to ask questions and all were  answered. The patient agreed with the plan and demonstrated an understanding of the instructions.   The patient was advised to call back or seek an in-person evaluation if worsening having concerns    or if the condition fails to improve as anticipated.  Berniece AndreasWanda Panosh, MD

## 2019-02-17 ENCOUNTER — Encounter: Payer: Self-pay | Admitting: Internal Medicine

## 2019-02-17 ENCOUNTER — Ambulatory Visit (INDEPENDENT_AMBULATORY_CARE_PROVIDER_SITE_OTHER): Payer: Self-pay | Admitting: Internal Medicine

## 2019-02-17 ENCOUNTER — Other Ambulatory Visit: Payer: Self-pay

## 2019-02-17 DIAGNOSIS — H1012 Acute atopic conjunctivitis, left eye: Secondary | ICD-10-CM

## 2019-04-20 ENCOUNTER — Ambulatory Visit (INDEPENDENT_AMBULATORY_CARE_PROVIDER_SITE_OTHER): Payer: Self-pay | Admitting: Internal Medicine

## 2019-04-20 ENCOUNTER — Encounter: Payer: Self-pay | Admitting: Internal Medicine

## 2019-04-20 ENCOUNTER — Other Ambulatory Visit: Payer: Self-pay

## 2019-04-20 DIAGNOSIS — R05 Cough: Secondary | ICD-10-CM

## 2019-04-20 DIAGNOSIS — L299 Pruritus, unspecified: Secondary | ICD-10-CM

## 2019-04-20 DIAGNOSIS — R0989 Other specified symptoms and signs involving the circulatory and respiratory systems: Secondary | ICD-10-CM

## 2019-04-20 DIAGNOSIS — R059 Cough, unspecified: Secondary | ICD-10-CM

## 2019-04-20 NOTE — Progress Notes (Signed)
Virtual Visit via Video Note  I connected with@ on 04/20/19 at  1:45 PM EDT by a video enabled telemedicine application and verified that I am speaking with the correct person using two identifiers. Location patient: home Location provider:work  office Persons participating in the virtual visit: patient, provider  [employer] national recommendations  regarding COVID 19 pandemic   video visit is advised over in office visit for this patient.  Patient aware  of the limitations of evaluation and management by telemedicine and  availability of in person appointments. and agreed to proceed.   HPI: Alyssa Lloyd presents for video visit Onset tender right ant node are days ago and then dry cough and runny nose  Itching all over in evening off and on for last weeks  No hives but does get a rash after scratching   No cp sob fever sweats sore throat   New meds or topicals  No exposures  Is at home in shut down  Taking zyrtec   And benadryl  Hs drowsiness  Pre adtaing meds   ROS: See pertinent positives and negatives per HPI.  Past Medical History:  Diagnosis Date  . 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 HERPES GENITALIS 11/02/2010   Qualifier: Diagnosis of  By: Regis Bill MD, Standley Brooking   . HSV (herpes simplex virus) anogenital infection   . PID (acute pelvic inflammatory disease) 01/11/2011    History reviewed. No pertinent surgical history.  Family History  Problem Relation Age of Onset  . Hypertension Unknown   . Gallbladder disease Mother   . Diabetes Unknown   . Asthma Unknown   . Anemia Unknown   . Allergies Unknown   . Migraines Unknown     Social History   Tobacco Use  . Smoking status: Never Smoker  . Smokeless tobacco: Never Used  Substance Use Topics  . Alcohol use: No  . Drug use: No      Current Outpatient Medications:  .  fluticasone (FLONASE) 50 MCG/ACT nasal spray, Place 2 sprays into both nostrils daily. (Patient not taking:  Reported on 11/12/2016), Disp: 16 g, Rfl: 0 .  ibuprofen (ADVIL,MOTRIN) 800 MG tablet, Take 1 tablet (800 mg total) by mouth every 8 (eight) hours as needed for moderate pain., Disp: 21 tablet, Rfl: 0 .  ondansetron (ZOFRAN) 4 MG tablet, Take 1 tablet (4 mg total) by mouth every 6 (six) hours., Disp: 12 tablet, Rfl: 0 .  Pseudoephedrine-APAP-DM (DAYQUIL MULTI-SYMPTOM COLD/FLU PO), Take 2 capsules by mouth every 6 (six) hours as needed (for flu symptoms.)., Disp: , Rfl:   EXAM: BP Readings from Last 3 Encounters:  09/02/18 130/86  08/17/17 (!) 130/94  12/03/16 147/98    VITALS per patient if applicable:  GENERAL: alert, oriented, appears well and in no acute distress  HEENT: atraumatic, conjunttiva clear, no obvious abnormalities on inspection of external nose and ears  NECK: normal movements of the head and neck  Points to right jdg area of tenderness  But looks normal   LUNGS: on inspection no signs of respiratory distress, breathing rate appears normal, no obvious gross SOB, gasping or wheezing  CV: no obvious cyanosis  Skin no acute findings on video   PSYCH/NEURO: pleasant and cooperative, no obvious depression or anxiety, speech and thought processing grossly intact Lab Results  Component Value Date   WBC 7.5 07/16/2016   HGB 11.8 (L) 07/16/2016   HCT 35.9 (L) 07/16/2016   PLT 349 07/16/2016   GLUCOSE 90  07/16/2016   CHOL 136 04/04/2015   TRIG 47.0 04/04/2015   HDL 47.70 04/04/2015   LDLCALC 79 04/04/2015   ALT 22 07/16/2016   AST 24 07/16/2016   NA 136 07/16/2016   K 3.4 (L) 07/16/2016   CL 103 07/16/2016   CREATININE 0.86 07/16/2016   BUN 8 07/16/2016   CO2 25 07/16/2016   TSH 1.14 04/04/2015    ASSESSMENT AND PLAN:  Discussed the following assessment and plan:    ICD-10-CM   1. Itching  L29.9   2. Cough  R05   3. Runny nose  R09.89    w right ac gland tenderness   Uncertain causes  Of mixture of  Sx  Some sound allergic and other viral like that should  be self resolving  No covid exposures   Close observation  Skin moisturizing  Inc to bid zyrtec and  Fu next week if  persistent or progressive or  If fever alarm sx    Counseled.   Expectant management and discussion of plan and treatment with opportunity to ask questions and all were answered. The patient agreed with the plan and demonstrated an understanding of the instructions.   Advised to call back or seek an in-person evaluation if worsening  or having  further concerns .  Berniece AndreasWanda Panosh, MD

## 2019-05-11 ENCOUNTER — Ambulatory Visit (INDEPENDENT_AMBULATORY_CARE_PROVIDER_SITE_OTHER): Payer: Self-pay | Admitting: Internal Medicine

## 2019-05-11 ENCOUNTER — Other Ambulatory Visit: Payer: Self-pay

## 2019-05-11 ENCOUNTER — Encounter: Payer: Self-pay | Admitting: Internal Medicine

## 2019-05-11 DIAGNOSIS — R059 Cough, unspecified: Secondary | ICD-10-CM

## 2019-05-11 DIAGNOSIS — R05 Cough: Secondary | ICD-10-CM

## 2019-05-11 DIAGNOSIS — R058 Other specified cough: Secondary | ICD-10-CM

## 2019-05-11 MED ORDER — MONTELUKAST SODIUM 10 MG PO TABS: 10.0000 mg | ORAL_TABLET | Freq: Every day | ORAL | 3 refills | Status: DC

## 2019-05-11 MED ORDER — ALBUTEROL SULFATE HFA 108 (90 BASE) MCG/ACT IN AERS: 1.0000 | INHALATION_SPRAY | Freq: Four times a day (QID) | RESPIRATORY_TRACT | 1 refills | Status: DC | PRN

## 2019-05-11 MED FILL — ALBUTEROL SULFATE HFA 108 (: 108 (90 BAS | 25 days supply | Qty: 9 | Fill #0

## 2019-05-11 MED FILL — MONTELUKAST SOD 10 MG TAB: 10 | 30 days supply | Qty: 30 | Fill #0

## 2019-05-11 NOTE — Progress Notes (Signed)
Virtual Visit via Video Note  I connected with@ on 05/11/19 at  9:45 AM EDT by a video enabled telemedicine application and verified that I am speaking with the correct person using two identifiers. Location patient: home Location provider:work o office Persons participating in the virtual visit: patient, provider  [place] national recommendations  regarding COVID 19 pandemic   video visit is advised over in office visit for this patient.  Patient aware  of the limitations of evaluation and management by telemedicine and  availability of in person appointments. and agreed to proceed.   HPI: Alyssa Lloyd presents for video visit  Fu itching and resp sx  Since then itching is better on bid zyrtec   Using ricola cough drops  Gets ocass tight cough and used moms left over Symbicort and helped her sx  She doesn't have cp or sob otherwise .   ROS: See pertinent positives and negatives per HPI. No fever itching  rahs now  Past Medical History:  Diagnosis Date  . 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 HERPES GENITALIS 11/02/2010   Qualifier: Diagnosis of  By: Fabian SharpPanosh MD, Neta MendsWanda K   . HSV (herpes simplex virus) anogenital infection   . PID (acute pelvic inflammatory disease) 01/11/2011    No past surgical history on file.  Family History  Problem Relation Age of Onset  . Hypertension Unknown   . Gallbladder disease Mother   . Diabetes Unknown   . Asthma Unknown   . Anemia Unknown   . Allergies Unknown   . Migraines Unknown     Social History   Tobacco Use  . Smoking status: Never Smoker  . Smokeless tobacco: Never Used  Substance Use Topics  . Alcohol use: No  . Drug use: No      Current Outpatient Medications:  .  albuterol (VENTOLIN HFA) 108 (90 Base) MCG/ACT inhaler, Inhale 1-2 puffs into the lungs every 6 (six) hours as needed for wheezing or shortness of breath., Disp: 18 g, Rfl: 1 .  fluticasone (FLONASE) 50 MCG/ACT nasal spray, Place 2  sprays into both nostrils daily. (Patient not taking: Reported on 11/12/2016), Disp: 16 g, Rfl: 0 .  ibuprofen (ADVIL,MOTRIN) 800 MG tablet, Take 1 tablet (800 mg total) by mouth every 8 (eight) hours as needed for moderate pain., Disp: 21 tablet, Rfl: 0 .  montelukast (SINGULAIR) 10 MG tablet, Take 1 tablet (10 mg total) by mouth at bedtime., Disp: 30 tablet, Rfl: 3 .  ondansetron (ZOFRAN) 4 MG tablet, Take 1 tablet (4 mg total) by mouth every 6 (six) hours., Disp: 12 tablet, Rfl: 0 .  Pseudoephedrine-APAP-DM (DAYQUIL MULTI-SYMPTOM COLD/FLU PO), Take 2 capsules by mouth every 6 (six) hours as needed (for flu symptoms.)., Disp: , Rfl:   EXAM: BP Readings from Last 3 Encounters:  09/02/18 130/86  08/17/17 (!) 130/94  12/03/16 147/98    VITALS per patient if applicable:  GENERAL: alert, oriented, appears well and in no acute distress HEENT: atraumatic, conjunttiva clear, no obvious abnormalities on inspection of external nose and ears NECK: normal movements of the head and neck LUNGS: on inspection no signs of respiratory distress, breathing rate appears normal, no obvious gross SOB, gasping or wheezing ocass dry cough CV: no obvious cyanosis MS: moves all visible extremities without noticeable abnormality  PSYCH/NEURO: pleasant and cooperative, no obvious depression or anxiety, speech and thought processing grossly intact  ASSESSMENT AND PLAN:  Discussed the following assessment and plan:    ICD-10-CM  1. Cough  R05   2. Allergic cough  R05   itching better  Stop cough drops add sugar free  Candies   Can try albuterol as needed and add trial Singulair  And let me know if helping and consider  continuing medication Counseled.   Expectant management and discussion of plan and treatment with opportunity to ask questions and all were answered. The patient agreed with the plan and demonstrated an understanding of the instructions.   Advised to call back or seek an in-person evaluation if  worsening  or having  further concerns .   Shanon Ace, MD

## 2020-02-08 ENCOUNTER — Other Ambulatory Visit: Payer: Self-pay

## 2020-02-08 ENCOUNTER — Telehealth (INDEPENDENT_AMBULATORY_CARE_PROVIDER_SITE_OTHER): Payer: Self-pay | Admitting: Internal Medicine

## 2020-02-08 ENCOUNTER — Encounter: Payer: Self-pay | Admitting: Internal Medicine

## 2020-02-08 ENCOUNTER — Other Ambulatory Visit: Payer: Self-pay | Admitting: Internal Medicine

## 2020-02-08 VITALS — Temp 98.7°F | Ht 63.0 in | Wt 245.0 lb

## 2020-02-08 DIAGNOSIS — R490 Dysphonia: Secondary | ICD-10-CM

## 2020-02-08 DIAGNOSIS — R05 Cough: Secondary | ICD-10-CM

## 2020-02-08 DIAGNOSIS — R059 Cough, unspecified: Secondary | ICD-10-CM

## 2020-02-08 DIAGNOSIS — R0989 Other specified symptoms and signs involving the circulatory and respiratory systems: Secondary | ICD-10-CM

## 2020-02-08 MED ORDER — FLUTICASONE PROPIONATE 50 MCG/ACT NA SUSP: 2.0000 | Freq: Every day | NASAL | 6 refills | Status: DC

## 2020-02-08 MED ORDER — KETOTIFEN FUMARATE 0.025 % OP SOLN: 1.0000 [drp] | Freq: Two times a day (BID) | OPHTHALMIC | 0 refills | Status: DC

## 2020-02-08 MED ORDER — ALBUTEROL SULFATE HFA 108 (90 BASE) MCG/ACT IN AERS: 1.0000 | INHALATION_SPRAY | Freq: Four times a day (QID) | RESPIRATORY_TRACT | 1 refills | Status: DC | PRN

## 2020-02-08 MED FILL — FLUTICASONE PROP 50 MCG SPR: 50 | 30 days supply | Qty: 16 | Fill #0

## 2020-02-08 NOTE — Progress Notes (Signed)
Virtual Visit via Video Note  I connected with@ on 02/08/20 at  2:00 PM EDT by a video enabled telemedicine application and verified that I am speaking with the correct person using two identifiers. Location patient: home Location provider:work or home office Persons participating in the virtual visit: patient, provider  [place] national recommendations  regarding COVID 19 pandemic   video visit is advised over in office visit for this patient.  Patient aware  of the limitations of evaluation and management by telemedicine and  availability of in person appointments. and agreed to proceed.   HPI: Alyssa Lloyd presents for video visit for resp sx   hsa had a few days of otchy throat and cough and runny nose   Some eye irritation  Tried albuterol not that helpful  Taking singulair and antihistamine   Not on flonase at this time. Rang out  No fever sob   Had neg covid testing routine last week but no sx ( works [employer])     ROS: See pertinent positives and negatives per HPI.  Past Medical History:  Diagnosis Date  . 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 HERPES GENITALIS 11/02/2010   Qualifier: Diagnosis of  By: Regis Bill MD, Standley Brooking   . HSV (herpes simplex virus) anogenital infection   . PID (acute pelvic inflammatory disease) 01/11/2011    History reviewed. No pertinent surgical history.  Family History  Problem Relation Age of Onset  . Hypertension Other   . Gallbladder disease Mother   . Diabetes Other   . Asthma Other   . Anemia Other   . Allergies Other   . Migraines Other     Social History   Tobacco Use  . Smoking status: Never Smoker  . Smokeless tobacco: Never Used  Substance Use Topics  . Alcohol use: No  . Drug use: No      Current Outpatient Medications:  .  albuterol (VENTOLIN HFA) 108 (90 Base) MCG/ACT inhaler, Inhale 1-2 puffs into the lungs every 6 (six) hours as needed for wheezing or shortness of breath., Disp:  18 g, Rfl: 1 .  cetirizine (ZYRTEC) 10 MG tablet, Take 10 mg by mouth daily., Disp: , Rfl:  .  ibuprofen (ADVIL,MOTRIN) 800 MG tablet, Take 1 tablet (800 mg total) by mouth every 8 (eight) hours as needed for moderate pain., Disp: 21 tablet, Rfl: 0 .  montelukast (SINGULAIR) 10 MG tablet, Take 1 tablet (10 mg total) by mouth at bedtime., Disp: 30 tablet, Rfl: 3 .  fluticasone (FLONASE) 50 MCG/ACT nasal spray, Place 2 sprays into both nostrils daily., Disp: 16 g, Rfl: 6 .  ketotifen (ZADITOR) 0.025 % ophthalmic solution, Place 1 drop into both eyes 2 (two) times daily. For allergies, Disp: 5 mL, Rfl: 0 .  ondansetron (ZOFRAN) 4 MG tablet, Take 1 tablet (4 mg total) by mouth every 6 (six) hours. (Patient not taking: Reported on 02/08/2020), Disp: 12 tablet, Rfl: 0 .  Pseudoephedrine-APAP-DM (DAYQUIL MULTI-SYMPTOM COLD/FLU PO), Take 2 capsules by mouth every 6 (six) hours as needed (for flu symptoms.)., Disp: , Rfl:   EXAM: BP Readings from Last 3 Encounters:  09/02/18 130/86  08/17/17 (!) 130/94  12/03/16 147/98    VITALS per patient if applicable:  GENERAL: alert, oriented, appears well and in no acute distress  Looks congested and ocass bronchial cough but no sob   HEENT: atraumatic, conjunttiva clear, no obvious abnormalities on inspection of external nose and ears  NECK: normal movements  of the head and neck  LUNGS: on inspection no signs of respiratory distress, breathing rate appears normal, no obvious gross SOB, gasping or wheezing  CV: no obvious cyanosis   PSYCH/NEURO: pleasant and cooperative, no obvious depression or anxiety, speech and thought processing grossly intact   ASSESSMENT AND PLAN:  Discussed the following assessment and plan:    ICD-10-CM   1. Cough  R05   2. Runny nose  R09.89   3. Hoarseness  R49.0    Seems to have a lot of resp sx and poss allergic underlying ing  In seasonal times    Consideration of allergy testing   Let us know if want referral or  other  consider resp clinic appt if wheezing and on go ing issues .   For now refill albuterol add Flonase cont Singulair and  Antihistamine  Counseled.  consider retesting for covid since new  Sx   For work  Return etc   Expectant management and discussion of plan and treatment with opportunity to ask questions and all were answered. The patient agreed with the plan and demonstrated an understanding of the instructions.   Advised to call back or seek an in-person evaluation if worsening  or having  further concerns . Return if symptoms worsen or fail to improve.    Berniece Andreas, MD

## 2020-07-11 ENCOUNTER — Other Ambulatory Visit: Payer: Medicaid Other

## 2020-07-11 ENCOUNTER — Other Ambulatory Visit: Payer: Self-pay

## 2020-07-11 DIAGNOSIS — Z20822 Contact with and (suspected) exposure to covid-19: Secondary | ICD-10-CM

## 2020-07-12 LAB — NOVEL CORONAVIRUS, NAA: SARS-CoV-2, NAA: NOT DETECTED

## 2020-07-21 NOTE — Progress Notes (Signed)
Chief Complaint  Patient presents with  . Annual Exam    Discuss body odor    HPI: Patient  Alyssa Lloyd  29 y.o. comes in today for Preventive Health Care visit   Resp:  Inhaler recent use at times   shows  And sings .  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  7  Days.  Some cramps  Cramps after day 2 .  Last [name] feb condoms    Odor bodyover past weeks unusual different areas of body doesn't  Correlate as possible ; uncertain if food  Soda related   .   Dark urine but not brown or blood   Hair supplement  Had covid at Easter.   Taste  came back ? Smell ? She was very tired with covid  Gets tested weekly at work for [employer]   Health Maintenance  Topic Date Due  . Hepatitis C Screening  Never done  . COVID-19 Vaccine (1) Never done  . PAP SMEAR-Modifier  04/03/2018  . INFLUENZA VACCINE  06/04/2020  . TETANUS/TDAP  04/03/2025  . HIV Screening  Completed   Health Maintenance Review LIFESTYLE:  Exercise:  Not as much  Work  Tobacco/ETS:  no Alcohol:  ocass Sugar beverages:soda daily  Dr Reino KentPepper   Sleep:  4-5 hours    Drug use: no HH of   1  No pets  Work: 6 am  2  Varies   .     [employer]   ROS:  GEN/ HEENT: No fever, significant weight changes sweats headaches vision problems hearing changes, CV/ PULM; No chest pain shortness of breath cough, syncope,edema  change in exercise tolerance. GI /GU: No adominal pain, vomiting, change in bowel habits. No blood in the stool. No significant GU symptoms. SKIN/HEME: ,no acute skin rashes suspicious lesions or bleeding. No lymphadenopathy, nodules, masses.  NEURO/ PSYCH:  No neurologic signs such as weakness numbness. No depression anxiety. IMM/ Allergy: No unusual infections.  Allergy .   REST of 12 system review negative except as per HPI   Past Medical History:  Diagnosis Date  . 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 HERPES GENITALIS 11/02/2010     Qualifier: Diagnosis of  By: Fabian SharpPanosh MD, Neta MendsWanda K   . HSV (herpes simplex virus) anogenital infection   . PID (acute pelvic inflammatory disease) 01/11/2011    History reviewed. No pertinent surgical history.  Family History  Problem Relation Age of Onset  . Hypertension Other   . Gallbladder disease Mother   . Diabetes Other   . Asthma Other   . Anemia Other   . Allergies Other   . Migraines Other     Social History   Socioeconomic History  . Marital status: Single    Spouse name: Not on file  . Number of children: Not on file  . Years of education: Not on file  . Highest education level: Not on file  Occupational History  . Not on file  Tobacco Use  . Smoking status: Never Smoker  . Smokeless tobacco: Never Used  Vaping Use  . Vaping Use: Never used  Substance and Sexual Activity  . Alcohol use: No  . Drug use: No  . Sexual activity: Not Currently  Other Topics Concern  . Not on file  Social History Narrative   ? Ets, in the past    Mom smoked and  now has stopped this year.   Non smoker    Works [employer] recently separated and divorcing stressful.  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       Was  5 # 9 oz FT  gestation   Social Determinants of Health   Financial Resource Strain:   . Difficulty of Paying Living Expenses: Not on file  Food Insecurity:   . Worried About Programme researcher, broadcasting/film/video in the Last Year: Not on file  . Ran Out of Food in the Last Year: Not on file  Transportation Needs:   . Lack of Transportation (Medical): Not on file  . Lack of Transportation (Non-Medical): Not on file  Physical Activity:   . Days of Exercise per Week: Not on file  . Minutes of Exercise per Session: Not on file  Stress:   . Feeling of Stress : Not on file  Social Connections:   . Frequency of Communication with Friends and Family: Not on file  . Frequency of Social Gatherings with Friends and Family: Not on file  . Attends Religious Services: Not on  file  . Active Member of Clubs or Organizations: Not on file  . Attends Banker Meetings: Not on file  . Marital Status: Not on file    Outpatient Medications Prior to Visit  Medication Sig Dispense Refill  . albuterol (VENTOLIN HFA) 108 (90 Base) MCG/ACT inhaler Inhale 1-2 puffs into the lungs every 6 (six) hours as needed for wheezing or shortness of breath. 18 g 1  . cetirizine (ZYRTEC) 10 MG tablet Take 10 mg by mouth daily.    . fluticasone (FLONASE) 50 MCG/ACT nasal spray Place 2 sprays into both nostrils daily. 16 g 6  . ketotifen (ZADITOR) 0.025 % ophthalmic solution Place 1 drop into both eyes 2 (two) times daily. For allergies 5 mL 0  . ibuprofen (ADVIL,MOTRIN) 800 MG tablet Take 1 tablet (800 mg total) by mouth every 8 (eight) hours as needed for moderate pain. (Patient not taking: Reported on 07/24/2020) 21 tablet 0  . montelukast (SINGULAIR) 10 MG tablet Take 1 tablet (10 mg total) by mouth at bedtime. (Patient not taking: Reported on 07/24/2020) 30 tablet 3  . ondansetron (ZOFRAN) 4 MG tablet Take 1 tablet (4 mg total) by mouth every 6 (six) hours. (Patient not taking: Reported on 02/08/2020) 12 tablet 0  . Pseudoephedrine-APAP-DM (DAYQUIL MULTI-SYMPTOM COLD/FLU PO) Take 2 capsules by mouth every 6 (six) hours as needed (for flu symptoms.). (Patient not taking: Reported on 07/24/2020)     No facility-administered medications prior to visit.     EXAM:  BP 128/68   Pulse 73   Temp 98.6 F (37 C) (Oral)   Ht 5' 2.75" (1.594 m)   Wt 238 lb 6.4 oz (108.1 kg)   SpO2 98%   BMI 42.57 kg/m   Body mass index is 42.57 kg/m. Wt Readings from Last 3 Encounters:  07/24/20 238 lb 6.4 oz (108.1 kg)  02/08/20 245 lb (111.1 kg)  09/02/18 223 lb (101.2 kg)    Physical Exam: Vital signs reviewed SLH:TDSK is a well-developed well-nourished alert cooperative    who appearsr stated age in no acute distress.  HEENT: normocephalic atraumatic , Eyes: PERRL EOM's full,  conjunctiva clear, Nares: paten,t no deformity discharge or tenderness., Ears: no deformity EAC's clear TMs with normal landmarks. Mouth: masked  NECK: supple without masses, x goiter noted no nodules  no bruits. CHEST/PULM:  Clear  to auscultation and percussion breath sounds equal no wheeze , rales or rhonchi. No chest wall deformities or tenderness. Breast: normal by inspection . No dimpling, discharge, masses, tenderness or discharge . CV: PMI is nondisplaced, S1 S2 no gallops, murmurs, rubs. Peripheral pulses are full without delay.No JVD .  ABDOMEN: Bowel sounds normal nontender  No guard or rebound, no hepato splenomegal no CVA tenderness.  No hernia. Extremtities:  No clubbing cyanosis or edema, no acute joint swelling or redness no focal atrophy NEURO:  Oriented x3, cranial nerves 3-12 appear to be intact, no obvious focal weakness,gait within normal limits no abnormal reflexes or asymmetrical SKIN: No acute rashes normal turgor, color, no bruising or petechiae.  Mild  Darkening neck  Nl axilla  PSYCH: Oriented, good eye contact, no obvious depression anxiety, cognition and judgment appear normal. LN: no cervical axillary inguinal adenopathy  Lab Results  Component Value Date   WBC 7.5 07/16/2016   HGB 11.8 (L) 07/16/2016   HCT 35.9 (L) 07/16/2016   PLT 349 07/16/2016   GLUCOSE 90 07/16/2016   CHOL 136 04/04/2015   TRIG 47.0 04/04/2015   HDL 47.70 04/04/2015   LDLCALC 79 04/04/2015   ALT 22 07/16/2016   AST 24 07/16/2016   NA 136 07/16/2016   K 3.4 (L) 07/16/2016   CL 103 07/16/2016   CREATININE 0.86 07/16/2016   BUN 8 07/16/2016   CO2 25 07/16/2016   TSH 1.14 04/04/2015    BP Readings from Last 3 Encounters:  07/24/20 128/68  09/02/18 130/86  08/17/17 (!) 130/94   Lab plan  reviewed with patient  Had nachos for lunch  Before lab   ASSESSMENT AND PLAN:  Discussed the following assessment and plan:    ICD-10-CM   1. Visit for preventive health examination  Z00.00  TSH    Hemoglobin A1c    Hepatic function panel    Lipid panel    BASIC METABOLIC PANEL WITH GFR    CBC with Differential/Platelet    T4, free    RPR    HIV Antibody (routine testing w rflx)    HIV Antibody (routine testing w rflx)    RPR    T4, free    CBC with Differential/Platelet    BASIC METABOLIC PANEL WITH GFR    Lipid panel    Hepatic function panel    Hemoglobin A1c    TSH  2. Personal history of covid-19  Z86.16 TSH    Hemoglobin A1c    Hepatic function panel    Lipid panel    BASIC METABOLIC PANEL WITH GFR    CBC with Differential/Platelet    T4, free    T4, free    CBC with Differential/Platelet    BASIC METABOLIC PANEL WITH GFR    Lipid panel    Hepatic function panel    Hemoglobin A1c    TSH  3. Abnormal body odor  R68.89 TSH    Hemoglobin A1c    Hepatic function panel    Lipid panel    BASIC METABOLIC PANEL WITH GFR    CBC with Differential/Platelet    T4, free    T4, free    CBC with Differential/Platelet    BASIC METABOLIC PANEL WITH GFR    Lipid panel    Hepatic function panel    Hemoglobin A1c    TSH  4. Goiter  E04.9 TSH    Hemoglobin A1c    Hepatic function panel    Lipid panel  BASIC METABOLIC PANEL WITH GFR    CBC with Differential/Platelet    T4, free    T4, free    CBC with Differential/Platelet    BASIC METABOLIC PANEL WITH GFR    Lipid panel    Hepatic function panel    Hemoglobin A1c    TSH  5. Morbid obesity (HCC)  E66.01 TSH    Hemoglobin A1c    Hepatic function panel    Lipid panel    BASIC METABOLIC PANEL WITH GFR    CBC with Differential/Platelet    T4, free    T4, free    CBC with Differential/Platelet    BASIC METABOLIC PANEL WITH GFR    Lipid panel    Hepatic function panel    Hemoglobin A1c    TSH  6. Need for hepatitis C screening test  Z11.59 Hepatitis C antibody    Hepatitis C antibody  7. Routine screening for STI (sexually transmitted infection)  Z11.3 RPR    HIV Antibody (routine testing w rflx)      HIV Antibody (routine testing w rflx)    RPR  had covid  Infection at [place] disc vaccine   Needs pap  Last one done 2016 and normal  On menses  Recheck  Needs pap done   Can use ibu 800  Or 2 aleve for cramps and follow  Unsure sig of change in odor  Follow  Disc  Return for for gyne pap smear and exam  .  Patient Care Team: Madelin Headings, MD as PCP - General Patient Instructions  Lab today non fasting   Avoid caffiene for 6 hours before trying to sleep  Avoid or limit sugar beverages to decrease risk of getting diabetes  And gaining weight.   Make appt for pap smear   consider getting the flu vaccine.   Stay hydrated  Plenty of water    Urine should be pale yellow .  Health Maintenance, Female Adopting a healthy lifestyle and getting preventive care are important in promoting health and wellness. Ask your health care provider about:  The right schedule for you to have regular tests and exams.  Things you can do on your own to prevent diseases and keep yourself healthy. What should I know about diet, weight, and exercise? Eat a healthy diet   Eat a diet that includes plenty of vegetables, fruits, low-fat dairy products, and lean protein.  Do not eat a lot of foods that are high in solid fats, added sugars, or sodium. Maintain a healthy weight Body mass index (BMI) is used to identify weight problems. It estimates body fat based on height and weight. Your health care provider can help determine your BMI and help you achieve or maintain a healthy weight. Get regular exercise Get regular exercise. This is one of the most important things you can do for your health. Most adults should:  Exercise for at least 150 minutes each week. The exercise should increase your heart rate and make you sweat (moderate-intensity exercise).  Do strengthening exercises at least twice a week. This is in addition to the moderate-intensity exercise.  Spend less time sitting. Even light  physical activity can be beneficial. Watch cholesterol and blood lipids Have your blood tested for lipids and cholesterol at 31 years of age, then have this test every 5 years. Have your cholesterol levels checked more often if:  Your lipid or cholesterol levels are high.  You are older than 31 years of age.  You are at high risk for heart disease. What should I know about cancer screening? Depending on your health history and family history, you may need to have cancer screening at various ages. This may include screening for:  Breast cancer.  Cervical cancer.  Colorectal cancer.  Skin cancer.  Lung cancer. What should I know about heart disease, diabetes, and high blood pressure? Blood pressure and heart disease  High blood pressure causes heart disease and increases the risk of stroke. This is more likely to develop in people who have high blood pressure readings, are of African descent, or are overweight.  Have your blood pressure checked: ? Every 3-5 years if you are 33-32 years of age. ? Every year if you are 68 years old or older. Diabetes Have regular diabetes screenings. This checks your fasting blood sugar level. Have the screening done:  Once every three years after age 32 if you are at a normal weight and have a low risk for diabetes.  More often and at a younger age if you are overweight or have a high risk for diabetes. What should I know about preventing infection? Hepatitis B If you have a higher risk for hepatitis B, you should be screened for this virus. Talk with your health care provider to find out if you are at risk for hepatitis B infection. Hepatitis C Testing is recommended for:  Everyone born from [birth year] through 1965.  Anyone with known risk factors for hepatitis C. Sexually transmitted infections (STIs)  Get screened for STIs, including gonorrhea and chlamydia, if: ? You are sexually active and are younger than 31 years of age. ? You are older  than 31 years of age and your health care provider tells you that you are at risk for this type of infection. ? Your sexual activity has changed since you were last screened, and you are at increased risk for chlamydia or gonorrhea. Ask your health care provider if you are at risk.  Ask your health care provider about whether you are at high risk for HIV. Your health care provider may recommend a prescription medicine to help prevent HIV infection. If you choose to take medicine to prevent HIV, you should first get tested for HIV. You should then be tested every 3 months for as long as you are taking the medicine. Pregnancy  If you are about to stop having your period (premenopausal) and you may become pregnant, seek counseling before you get pregnant.  Take 400 to 800 micrograms (mcg) of folic acid every day if you become pregnant.  Ask for birth control (contraception) if you want to prevent pregnancy. Osteoporosis and menopause Osteoporosis is a disease in which the bones lose minerals and strength with aging. This can result in bone fractures. If you are 68 years old or older, or if you are at risk for osteoporosis and fractures, ask your health care provider if you should:  Be screened for bone loss.  Take a calcium or vitamin D supplement to lower your risk of fractures.  Be given hormone replacement therapy (HRT) to treat symptoms of menopause. Follow these instructions at home: Lifestyle  Do not use any products that contain nicotine or tobacco, such as cigarettes, e-cigarettes, and chewing tobacco. If you need help quitting, ask your health care provider.  Do not use street drugs.  Do not share needles.  Ask your health care provider for help if you need support or information about quitting drugs. Alcohol use  Do not  drink alcohol if: ? Your health care provider tells you not to drink. ? You are pregnant, may be pregnant, or are planning to become pregnant.  If you drink  alcohol: ? Limit how much you use to 0-1 drink a day. ? Limit intake if you are breastfeeding.  Be aware of how much alcohol is in your drink. In the [place] one drink equals one 12 oz bottle of beer (355 mL), one 5 oz glass of wine (148 mL), or one 1 oz glass of hard liquor (44 mL). General instructions  Schedule regular health, dental, and eye exams.  Stay current with your vaccines.  Tell your health care provider if: ? You often feel depressed. ? You have ever been abused or do not feel safe at home. Summary  Adopting a healthy lifestyle and getting preventive care are important in promoting health and wellness.  Follow your health care provider's instructions about healthy diet, exercising, and getting tested or screened for diseases.  Follow your health care provider's instructions on monitoring your cholesterol and blood pressure. This information is not intended to replace advice given to you by your health care provider. Make sure you discuss any questions you have with your health care provider. Document Revised: 10/14/2018 Document Reviewed: 10/14/2018 Elsevier Patient Education  2020 [employer].        [place] K. Benita Boonstra M.D.

## 2020-07-24 ENCOUNTER — Ambulatory Visit (INDEPENDENT_AMBULATORY_CARE_PROVIDER_SITE_OTHER): Payer: Medicaid Other | Admitting: Internal Medicine

## 2020-07-24 ENCOUNTER — Encounter: Payer: Self-pay | Admitting: Internal Medicine

## 2020-07-24 ENCOUNTER — Other Ambulatory Visit: Payer: Self-pay

## 2020-07-24 VITALS — BP 128/68 | HR 73 | Temp 98.6°F | Ht 62.75 in | Wt 238.4 lb

## 2020-07-24 DIAGNOSIS — Z8616 Personal history of COVID-19: Secondary | ICD-10-CM

## 2020-07-24 DIAGNOSIS — Z113 Encounter for screening for infections with a predominantly sexual mode of transmission: Secondary | ICD-10-CM

## 2020-07-24 DIAGNOSIS — Z0000 Encounter for general adult medical examination without abnormal findings: Secondary | ICD-10-CM

## 2020-07-24 DIAGNOSIS — Z1159 Encounter for screening for other viral diseases: Secondary | ICD-10-CM

## 2020-07-24 DIAGNOSIS — L750 Bromhidrosis: Secondary | ICD-10-CM

## 2020-07-24 DIAGNOSIS — R6889 Other general symptoms and signs: Secondary | ICD-10-CM

## 2020-07-24 DIAGNOSIS — E049 Nontoxic goiter, unspecified: Secondary | ICD-10-CM

## 2020-07-24 NOTE — Patient Instructions (Signed)
Lab today non fasting   Avoid caffiene for 6 hours before trying to sleep  Avoid or limit sugar beverages to decrease risk of getting diabetes  And gaining weight.   Make appt for pap smear   consider getting the flu vaccine.   Stay hydrated  Plenty of water    Urine should be pale yellow .  Health Maintenance, Female Adopting a healthy lifestyle and getting preventive care are important in promoting health and wellness. Ask your health care provider about:  The right schedule for you to have regular tests and exams.  Things you can do on your own to prevent diseases and keep yourself healthy. What should I know about diet, weight, and exercise? Eat a healthy diet   Eat a diet that includes plenty of vegetables, fruits, low-fat dairy products, and lean protein.  Do not eat a lot of foods that are high in solid fats, added sugars, or sodium. Maintain a healthy weight Body mass index (BMI) is used to identify weight problems. It estimates body fat based on height and weight. Your health care provider can help determine your BMI and help you achieve or maintain a healthy weight. Get regular exercise Get regular exercise. This is one of the most important things you can do for your health. Most adults should:  Exercise for at least 150 minutes each week. The exercise should increase your heart rate and make you sweat (moderate-intensity exercise).  Do strengthening exercises at least twice a week. This is in addition to the moderate-intensity exercise.  Spend less time sitting. Even light physical activity can be beneficial. Watch cholesterol and blood lipids Have your blood tested for lipids and cholesterol at 31 years of age, then have this test every 5 years. Have your cholesterol levels checked more often if:  Your lipid or cholesterol levels are high.  You are older than 31 years of age.  You are at high risk for heart disease. What should I know about cancer  screening? Depending on your health history and family history, you may need to have cancer screening at various ages. This may include screening for:  Breast cancer.  Cervical cancer.  Colorectal cancer.  Skin cancer.  Lung cancer. What should I know about heart disease, diabetes, and high blood pressure? Blood pressure and heart disease  High blood pressure causes heart disease and increases the risk of stroke. This is more likely to develop in people who have high blood pressure readings, are of African descent, or are overweight.  Have your blood pressure checked: ? Every 3-5 years if you are 83-55 years of age. ? Every year if you are 68 years old or older. Diabetes Have regular diabetes screenings. This checks your fasting blood sugar level. Have the screening done:  Once every three years after age 85 if you are at a normal weight and have a low risk for diabetes.  More often and at a younger age if you are overweight or have a high risk for diabetes. What should I know about preventing infection? Hepatitis B If you have a higher risk for hepatitis B, you should be screened for this virus. Talk with your health care provider to find out if you are at risk for hepatitis B infection. Hepatitis C Testing is recommended for:  Everyone born from [birth year] through 1967.  Anyone with known risk factors for hepatitis C. Sexually transmitted infections (STIs)  Get screened for STIs, including gonorrhea and chlamydia, if: ? You are  sexually active and are younger than 31 years of age. ? You are older than 31 years of age and your health care provider tells you that you are at risk for this type of infection. ? Your sexual activity has changed since you were last screened, and you are at increased risk for chlamydia or gonorrhea. Ask your health care provider if you are at risk.  Ask your health care provider about whether you are at high risk for HIV. Your health care provider may  recommend a prescription medicine to help prevent HIV infection. If you choose to take medicine to prevent HIV, you should first get tested for HIV. You should then be tested every 3 months for as long as you are taking the medicine. Pregnancy  If you are about to stop having your period (premenopausal) and you may become pregnant, seek counseling before you get pregnant.  Take 400 to 800 micrograms (mcg) of folic acid every day if you become pregnant.  Ask for birth control (contraception) if you want to prevent pregnancy. Osteoporosis and menopause Osteoporosis is a disease in which the bones lose minerals and strength with aging. This can result in bone fractures. If you are 69 years old or older, or if you are at risk for osteoporosis and fractures, ask your health care provider if you should:  Be screened for bone loss.  Take a calcium or vitamin D supplement to lower your risk of fractures.  Be given hormone replacement therapy (HRT) to treat symptoms of menopause. Follow these instructions at home: Lifestyle  Do not use any products that contain nicotine or tobacco, such as cigarettes, e-cigarettes, and chewing tobacco. If you need help quitting, ask your health care provider.  Do not use street drugs.  Do not share needles.  Ask your health care provider for help if you need support or information about quitting drugs. Alcohol use  Do not drink alcohol if: ? Your health care provider tells you not to drink. ? You are pregnant, may be pregnant, or are planning to become pregnant.  If you drink alcohol: ? Limit how much you use to 0-1 drink a day. ? Limit intake if you are breastfeeding.  Be aware of how much alcohol is in your drink. In the [place] one drink equals one 12 oz bottle of beer (355 mL), one 5 oz glass of wine (148 mL), or one 1 oz glass of hard liquor (44 mL). General instructions  Schedule regular health, dental, and eye exams.  Stay current with your  vaccines.  Tell your health care provider if: ? You often feel depressed. ? You have ever been abused or do not feel safe at home. Summary  Adopting a healthy lifestyle and getting preventive care are important in promoting health and wellness.  Follow your health care provider's instructions about healthy diet, exercising, and getting tested or screened for diseases.  Follow your health care provider's instructions on monitoring your cholesterol and blood pressure. This information is not intended to replace advice given to you by your health care provider. Make sure you discuss any questions you have with your health care provider. Document Revised: 10/14/2018 Document Reviewed: 10/14/2018 Elsevier Patient Education  2020 [employer].

## 2020-07-26 LAB — CBC WITH DIFFERENTIAL/PLATELET
Absolute Monocytes: 490 cells/uL (ref 200–950)
Basophils Absolute: 37 cells/uL (ref 0–200)
Basophils Relative: 0.6 %
Eosinophils Absolute: 657 cells/uL — ABNORMAL HIGH (ref 15–500)
Eosinophils Relative: 10.6 %
HCT: 36.6 % (ref 35.0–45.0)
Hemoglobin: 11.3 g/dL — ABNORMAL LOW (ref 11.7–15.5)
Lymphs Abs: 2269 cells/uL (ref 850–3900)
MCH: 25.1 pg — ABNORMAL LOW (ref 27.0–33.0)
MCHC: 30.9 g/dL — ABNORMAL LOW (ref 32.0–36.0)
MCV: 81.2 fL (ref 80.0–100.0)
MPV: 10.3 fL (ref 7.5–12.5)
Monocytes Relative: 7.9 %
Neutro Abs: 2747 cells/uL (ref 1500–7800)
Neutrophils Relative %: 44.3 %
Platelets: 377 10*3/uL (ref 140–400)
RBC: 4.51 10*6/uL (ref 3.80–5.10)
RDW: 13.2 % (ref 11.0–15.0)
Total Lymphocyte: 36.6 %
WBC: 6.2 10*3/uL (ref 3.8–10.8)

## 2020-07-26 LAB — BASIC METABOLIC PANEL WITH GFR
BUN: 11 mg/dL (ref 7–25)
CO2: 29 mmol/L (ref 20–32)
Calcium: 9.8 mg/dL (ref 8.6–10.2)
Chloride: 105 mmol/L (ref 98–110)
Creat: 0.92 mg/dL (ref 0.50–1.10)
GFR, Est African American: 97 mL/min/{1.73_m2} (ref >=60)
GFR, Est Non African American: 84 mL/min/{1.73_m2} (ref >=60)
Glucose, Bld: 59 mg/dL — ABNORMAL LOW (ref 65–99)
Potassium: 3.8 mmol/L (ref 3.5–5.3)
Sodium: 139 mmol/L (ref 135–146)

## 2020-07-26 LAB — HEMOGLOBIN A1C
Hgb A1c MFr Bld: 5.3 % of total Hgb (ref <5.7)
Mean Plasma Glucose: 105 (calc)
eAG (mmol/L): 5.8 (calc)

## 2020-07-26 LAB — HEPATITIS C ANTIBODY
Hepatitis C Ab: NONREACTIVE
SIGNAL TO CUT-OFF: 0.03 (ref <1.00)

## 2020-07-26 LAB — LIPID PANEL
Cholesterol: 158 mg/dL (ref <200)
HDL: 46 mg/dL — ABNORMAL LOW (ref >=50)
LDL Cholesterol (Calc): 95 mg/dL (calc)
Non-HDL Cholesterol (Calc): 112 mg/dL (calc) (ref <130)
Total CHOL/HDL Ratio: 3.4 (calc) (ref <5.0)
Triglycerides: 83 mg/dL (ref <150)

## 2020-07-26 LAB — HEPATIC FUNCTION PANEL
AG Ratio: 1.3 (calc) (ref 1.0–2.5)
ALT: 9 U/L (ref 6–29)
AST: 14 U/L (ref 10–30)
Albumin: 4.4 g/dL (ref 3.6–5.1)
Alkaline phosphatase (APISO): 72 U/L (ref 31–125)
Bilirubin, Direct: 0.1 mg/dL (ref 0.0–0.2)
Globulin: 3.3 g/dL (calc) (ref 1.9–3.7)
Indirect Bilirubin: 0.3 mg/dL (calc) (ref 0.2–1.2)
Total Bilirubin: 0.4 mg/dL (ref 0.2–1.2)
Total Protein: 7.7 g/dL (ref 6.1–8.1)

## 2020-07-26 LAB — FLUORESCENT TREPONEMAL AB(FTA)-IGG-BLD: Fluorescent Treponemal ABS: REACTIVE — AB

## 2020-07-26 LAB — T4, FREE: Free T4: 1 ng/dL (ref 0.8–1.8)

## 2020-07-26 LAB — RPR: RPR Ser Ql: REACTIVE — AB

## 2020-07-26 LAB — RPR TITER: RPR Titer: 1:2 {titer} — ABNORMAL HIGH

## 2020-07-26 LAB — HIV ANTIBODY (ROUTINE TESTING W REFLEX): HIV 1&2 Ab, 4th Generation: NONREACTIVE

## 2020-07-26 LAB — TSH: TSH: 1.25 mIU/L

## 2020-07-27 ENCOUNTER — Telehealth: Payer: Self-pay

## 2020-07-27 ENCOUNTER — Other Ambulatory Visit: Payer: Self-pay

## 2020-07-27 ENCOUNTER — Ambulatory Visit (INDEPENDENT_AMBULATORY_CARE_PROVIDER_SITE_OTHER): Payer: Self-pay

## 2020-07-27 DIAGNOSIS — A530 Latent syphilis, unspecified as early or late: Secondary | ICD-10-CM

## 2020-07-27 MED ORDER — PENICILLIN G BENZATHINE 1200000 UNIT/2ML IM SUSP
1.2000 10*6.[IU] | Freq: Once | INTRAMUSCULAR | Status: AC
  Administered 2020-07-27: 1.2 10*6.[IU] via INTRAMUSCULAR

## 2020-07-27 NOTE — Patient Instructions (Signed)
Health Maintenance Due  Topic Date Due  . COVID-19 Vaccine (1) Never done  . PAP SMEAR-Modifier  04/03/2018  . INFLUENZA VACCINE  06/04/2020    Depression screen PHQ 2/9 07/24/2020  Decreased Interest 0  Down, Depressed, Hopeless 1  PHQ - 2 Score 1  Altered sleeping 2  Tired, decreased energy 2  Change in appetite 2  Feeling bad or failure about yourself  0  Trouble concentrating 0  Moving slowly or fidgety/restless 0  Suicidal thoughts 0  PHQ-9 Score 7  Difficult doing work/chores Not difficult at all

## 2020-07-27 NOTE — Telephone Encounter (Signed)
Can you result lab results for patient? I [name] and he stated that he needs to make sure we are treating her and when we need to have her tested again?  Please advise.    [9:18 AM] Leonette Nutting! I got a call on the backline from the state health department about a patient of Dr. Fabian Sharp that tested "weakly" positive for COVID and wanted to know if we were going to retest her. Patient is Alyssa Lloyd July 19, 1991. Can you call him back at [phone]? He mentioned you by name so I figured you may know what's going on

## 2020-07-27 NOTE — Progress Notes (Signed)
Per orders of Dr. Fabian Sharp, injection of Bicillin given to pt in Left gluteus by Sherrin Daisy. Patient tolerated injection well.

## 2020-07-27 NOTE — Telephone Encounter (Signed)
Called patient and gave her the positive syphllis results and Jamie scheduled her to have her injection on a nurse visit. I advised for patient to discuss during follow up visit with Dr. Fabian Sharp.  Called Elfredia Nevins from the state health department and he wants to know if we are treating her 3 times?  Please advise.

## 2020-07-27 NOTE — Telephone Encounter (Signed)
Go ahead and treat with 2.4 million  units of bicillin im  And then plan follow up   Discussion at her  Fu gyne visit    In 2 weeks   Prefer dot tomorrow  Will plan fu testing also at that time .

## 2020-07-27 NOTE — Progress Notes (Signed)
Treat rpr  low titer  as positive  with bicillin 2.4 million units  im  to cover  rest of labs   ok  but mild anemia  Thyroid ok.  Would  pl an fu rpr labs titer as per protocols  Can go to  health department also for fu.

## 2020-07-27 NOTE — Telephone Encounter (Signed)
So please ask patient if she wishes to come here or to the health department   Empir[facility] treatment  Is bicillin  Every week x 3    If she wishes to get the injections here   please arrange for her to repeat the bicillin 2.4 million  im  In a week and in 2 weeks ( I thinks he has appt October  5   For pap  Smear  If she wants she can  change her appt to OCt 6 or Oct 8 and get the 3rd shot at her visit   .

## 2020-07-27 NOTE — Telephone Encounter (Signed)
We can discuss at your  Upcoming visit  .

## 2020-07-28 NOTE — Telephone Encounter (Signed)
Pt came in and received the Bicillin at [facility].

## 2020-07-31 NOTE — Telephone Encounter (Signed)
Alyssa Lloyd  From Division of Public Health with state of [place]  He was needing to know what was the counseling given to this patient.  Please advise

## 2020-08-01 NOTE — Telephone Encounter (Signed)
Spoke with [employer]. Patient demographics and past lab test given.

## 2020-08-08 ENCOUNTER — Other Ambulatory Visit: Payer: Self-pay | Admitting: Internal Medicine

## 2020-08-08 ENCOUNTER — Other Ambulatory Visit: Payer: Self-pay

## 2020-08-08 ENCOUNTER — Ambulatory Visit (INDEPENDENT_AMBULATORY_CARE_PROVIDER_SITE_OTHER): Payer: Self-pay | Admitting: Internal Medicine

## 2020-08-08 ENCOUNTER — Other Ambulatory Visit (HOSPITAL_COMMUNITY)
Admission: RE | Admit: 2020-08-08 | Discharge: 2020-08-08 | Disposition: A | Discharge status: Home / Self Care | Payer: Medicaid Other | Source: Ambulatory Visit | Attending: Internal Medicine | Admitting: Internal Medicine

## 2020-08-08 ENCOUNTER — Encounter: Payer: Self-pay | Admitting: Internal Medicine

## 2020-08-08 VITALS — BP 118/80 | HR 84 | Temp 98.9°F | Ht 62.75 in | Wt 238.4 lb

## 2020-08-08 DIAGNOSIS — Z01419 Encounter for gynecological examination (general) (routine) without abnormal findings: Secondary | ICD-10-CM | POA: Insufficient documentation

## 2020-08-08 DIAGNOSIS — A530 Latent syphilis, unspecified as early or late: Secondary | ICD-10-CM

## 2020-08-08 DIAGNOSIS — D649 Anemia, unspecified: Secondary | ICD-10-CM

## 2020-08-08 MED ORDER — DOXYCYCLINE HYCLATE 100 MG PO CAPS: 100.0000 mg | ORAL_CAPSULE | Freq: Two times a day (BID) | ORAL | 0 refills | Status: DC

## 2020-08-08 MED FILL — DOXYCYCLINE HYCLATE 100 MG: 100 | 14 days supply | Qty: 28 | Fill #0

## 2020-08-08 NOTE — Progress Notes (Signed)
Chief Complaint  Patient presents with  . Gynecologic Exam    HPI: Alyssa Lloyd 29 y.o. come in for pap smear and fu of   Borderline pos fta titer  Doing ok   no gyne vag sx at present  She had bicillin injection about 10 days ago  No current partner   Had one monogamous  for about  A years   Last  ssa  Feb 2021  No hx of same  No known contacts   Prev partner  Years before  Gi uppr sx seem to be when eats certain things? lactose intolerance no blood in stool or unintended weight loss  ROS: See pertinent positives and negatives per HPI. No hx of syphilis sx  Ulcers   Has rashes but not typical   Past Medical History:  Diagnosis Date  . 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 HERPES GENITALIS 11/02/2010   Qualifier: Diagnosis of  By: Fabian Sharp MD, Neta Mends   . HSV (herpes simplex virus) anogenital infection   . PID (acute pelvic inflammatory disease) 01/11/2011    Family History  Problem Relation Age of Onset  . Hypertension Other   . Gallbladder disease Mother   . Diabetes Other   . Asthma Other   . Anemia Other   . Allergies Other   . Migraines Other     Social History   Socioeconomic History  . Marital status: Single    Spouse name: Not on file  . Number of children: Not on file  . Years of education: Not on file  . Highest education level: Not on file  Occupational History  . Not on file  Tobacco Use  . Smoking status: Never Smoker  . Smokeless tobacco: Never Used  Vaping Use  . Vaping Use: Never used  Substance and Sexual Activity  . Alcohol use: No  . Drug use: No  . Sexual activity: Not Currently  Other Topics Concern  . Not on file  Social History Narrative   ? Ets, in the past    Mom smoked and now has stopped this year.   Non smoker    Works [employer] recently separated and divorcing stressful.  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       Was  5 # 9 oz FT  gestation   Social Determinants of  Health   Financial Resource Strain:   . Difficulty of Paying Living Expenses: Not on file  Food Insecurity:   . Worried About Programme researcher, broadcasting/film/video in the Last Year: Not on file  . Ran Out of Food in the Last Year: Not on file  Transportation Needs:   . Lack of Transportation (Medical): Not on file  . Lack of Transportation (Non-Medical): Not on file  Physical Activity:   . Days of Exercise per Week: Not on file  . Minutes of Exercise per Session: Not on file  Stress:   . Feeling of Stress : Not on file  Social Connections:   . Frequency of Communication with Friends and Family: Not on file  . Frequency of Social Gatherings with Friends and Family: Not on file  . Attends Religious Services: Not on file  . Active Member of Clubs or Organizations: Not on file  . Attends Banker Meetings: Not on file  . Marital Status: Not on file    Outpatient Medications Prior to Visit  Medication Sig Dispense Refill  .  albuterol (VENTOLIN HFA) 108 (90 Base) MCG/ACT inhaler Inhale 1-2 puffs into the lungs every 6 (six) hours as needed for wheezing or shortness of breath. 18 g 1  . cetirizine (ZYRTEC) 10 MG tablet Take 10 mg by mouth daily.    . fluticasone (FLONASE) 50 MCG/ACT nasal spray Place 2 sprays into both nostrils daily. 16 g 6  . ibuprofen (ADVIL,MOTRIN) 800 MG tablet Take 1 tablet (800 mg total) by mouth every 8 (eight) hours as needed for moderate pain. 21 tablet 0  . ketotifen (ZADITOR) 0.025 % ophthalmic solution Place 1 drop into both eyes 2 (two) times daily. For allergies 5 mL 0  . montelukast (SINGULAIR) 10 MG tablet Take 1 tablet (10 mg total) by mouth at bedtime. (Patient not taking: Reported on 07/24/2020) 30 tablet 3  . Pseudoephedrine-APAP-DM (DAYQUIL MULTI-SYMPTOM COLD/FLU PO) Take 2 capsules by mouth every 6 (six) hours as needed (for flu symptoms.). (Patient not taking: Reported on 07/24/2020)     No facility-administered medications prior to visit.      EXAM:  BP 118/80 (BP Location: Left Arm, Patient Position: Sitting, Cuff Size: Large)   Pulse 84   Temp 98.9 F (37.2 C) (Oral)   Ht 5' 2.75" (1.594 m)   Wt 238 lb 6.4 oz (108.1 kg)   SpO2 98%   BMI 42.57 kg/m   Body mass index is 42.57 kg/m.  GENERAL: vitals reviewed and listed above, alert, oriented, appears well hydrated and in no acute distress HEENT: atraumatic, conjunctiva  clear, no obvious abnormalities on inspection of external nose and ears OP :masked NECK: no obvious masses on inspection palpation  See exam from previous visit  Pelvic: NL ext GU, labia clear without lesions or rash . Vagina no lesions .Cervix: cleardc  UTERUS: Neg CMT Adnexa:  clear no masses . PAP done with gc chlamydia and hpv markers  PSYCH: pleasant and cooperative, no obvious depression or anxiety Lab Results  Component Value Date   WBC 6.2 07/24/2020   HGB 11.3 (L) 07/24/2020   HCT 36.6 07/24/2020   PLT 377 07/24/2020   GLUCOSE 59 (L) 07/24/2020   CHOL 158 07/24/2020   TRIG 83 07/24/2020   HDL 46 (L) 07/24/2020   LDLCALC 95 07/24/2020   ALT 9 07/24/2020   AST 14 07/24/2020   NA 139 07/24/2020   K 3.8 07/24/2020   CL 105 07/24/2020   CREATININE 0.92 07/24/2020   BUN 11 07/24/2020   CO2 29 07/24/2020   TSH 1.25 07/24/2020   HGBA1C 5.3 07/24/2020   BP Readings from Last 3 Encounters:  08/08/20 118/80  07/24/20 128/68  09/02/18 130/86   Reviewed labs  ASSESSMENT AND PLAN:  Discussed the following assessment and plan:  Pap smear, as part of routine gynecological examination - Plan: PAP [[place]]  Positive RPR test - borderline low titer  - Plan: CBC with Differential/Platelet, Iron, TIBC and Ferritin Panel, RPR  Mild anemia - Plan: CBC with Differential/Platelet, Iron, TIBC and Ferritin Panel, RPR PAP  gc chlamydia  Due and hpv  Exam is normal today  Mild anemia  Appears  Was given  Dose pcn 9 23 only 1.2? Million units although 2.4 was ordered      Early vs  a false  positive or early latent .   reviewed hx of hives with doxycyline  Had rash  Itchy   With illness and  Was stopped but no exam ( I reviewed notes) no anaphylactic  Sx )  She  apparently had iv meds in ed ?    Options of   High dose bicillin   agrees to try   Oral doxy under caution and benadryl   To call this week about how doing .  Stop if alarm sx  Otherwise can use benadryl in case  Pre medicate .  Plan fu labs earlier in 3 months  rpr and anemia fu  Disc that better to treat in case than  Under treat this   Possible infection  -Patient advised to return or notify health care team  if  new concerns arise.  Patient Instructions  We can try doxycycline again and take benadryl in case ( in liew of another bicillin injection)   If getting serious allergic reaction then have to stop and regroup treatment.   Take iron supplement every other day to help anemia .   Plan fu depending on   Results  Will need follo wup labs   Plan  rpr   In3-4 months with  cbcdiff  For anemia check     Neta Mends. Dashley Monts M.D.

## 2020-08-08 NOTE — Patient Instructions (Addendum)
We can try doxycycline again and take benadryl in case ( in liew of another bicillin injection)   If getting serious allergic reaction then have to stop and regroup treatment.   Take iron supplement every other day to help anemia .   Plan fu depending on   Results  Will need follo wup labs   Plan  rpr   In3-4 months with  cbcdiff  For anemia check

## 2020-08-10 LAB — CYTOLOGY - PAP
Adequacy: ABSENT
Chlamydia: NEGATIVE
Comment: NEGATIVE
Comment: NEGATIVE
Comment: NORMAL
Diagnosis: NEGATIVE
High risk HPV: NEGATIVE
Neisseria Gonorrhea: NEGATIVE

## 2020-08-10 NOTE — Progress Notes (Signed)
Pap smear is normal and negative  chlamydia gc screen . Repeat  pap in 3-4 years or as needed

## 2020-08-24 MED FILL — DOXYCYCLINE HYCLATE 100 MG: 100 | 14 days supply | Qty: 28 | Fill #0

## 2020-08-29 MED FILL — ALBUTEROL SULFATE HFA 108 (: 108 (90 BAS | 25 days supply | Qty: 18 | Fill #0

## 2020-09-01 NOTE — Telephone Encounter (Signed)
Tell patient  Alyssa Lloyd that she was able to take all the medication.  Make a lab appt for about 4-6 months from now  ( labs orders are  in system ) to recheckand anemia  And then a rov to review

## 2020-10-10 ENCOUNTER — Other Ambulatory Visit: Payer: Self-pay

## 2020-10-10 ENCOUNTER — Encounter (HOSPITAL_COMMUNITY): Payer: Self-pay

## 2020-10-10 DIAGNOSIS — Z8669 Personal history of other diseases of the nervous system and sense organs: Secondary | ICD-10-CM | POA: Insufficient documentation

## 2020-10-10 DIAGNOSIS — R590 Localized enlarged lymph nodes: Secondary | ICD-10-CM | POA: Insufficient documentation

## 2020-10-10 DIAGNOSIS — H538 Other visual disturbances: Secondary | ICD-10-CM | POA: Insufficient documentation

## 2020-10-10 DIAGNOSIS — R519 Headache, unspecified: Secondary | ICD-10-CM | POA: Insufficient documentation

## 2020-10-10 NOTE — ED Triage Notes (Signed)
Pt reports migraine and blurred vision x3 days. Pt reports she noticed a lump on the back of her head today that is tender to touch. Pt denies N/V. A&O x4.

## 2020-10-11 ENCOUNTER — Emergency Department (HOSPITAL_COMMUNITY)
Admission: EM | Admit: 2020-10-11 | Discharge: 2020-10-11 | Disposition: A | Discharge status: Home / Self Care | Payer: Medicaid Other | Attending: Emergency Medicine | Admitting: Emergency Medicine

## 2020-10-11 DIAGNOSIS — R590 Localized enlarged lymph nodes: Secondary | ICD-10-CM

## 2020-10-11 DIAGNOSIS — R519 Headache, unspecified: Secondary | ICD-10-CM

## 2020-10-11 MED ORDER — SODIUM CHLORIDE 0.9 % IV BOLUS
500.0000 mL | Freq: Once | INTRAVENOUS | Status: AC
  Administered 2020-10-11: 500 mL via INTRAVENOUS

## 2020-10-11 MED ORDER — KETOROLAC TROMETHAMINE 30 MG/ML IJ SOLN
30.0000 mg | Freq: Once | INTRAMUSCULAR | Status: AC
  Administered 2020-10-11: 30 mg via INTRAVENOUS
  Filled 2020-10-11: qty 1

## 2020-10-11 MED ORDER — DIPHENHYDRAMINE HCL 50 MG/ML IJ SOLN
12.5000 mg | Freq: Once | INTRAMUSCULAR | Status: AC
  Administered 2020-10-11: 12.5 mg via INTRAVENOUS
  Filled 2020-10-11: qty 1

## 2020-10-11 MED ORDER — PROCHLORPERAZINE EDISYLATE 10 MG/2ML IJ SOLN
10.0000 mg | Freq: Once | INTRAMUSCULAR | Status: AC
  Administered 2020-10-11: 10 mg via INTRAVENOUS
  Filled 2020-10-11: qty 2

## 2020-10-11 NOTE — Discharge Instructions (Signed)
Thank you for allowing me to care for you today in the Emergency Department.   Please follow-up with your primary care provider if in the swollen area on the back of your head does not significantly improve or resolve in the next week.  You can apply warm compresses to the area for 15 to 20 minutes up to 3 times a day.  Avoid rubbing the area as this may make it more tender and more painful.  Return to the emergency department if you have a severe headache with new numbness or weakness, double vision, loss of vision in 1 or both eyes, if you become unable to move your neck, or develop other new, concerning symptoms.

## 2020-10-11 NOTE — ED Provider Notes (Signed)
[facility]-EMERGENCY DEPT Provider Note   CSN: [id number] Arrival date & time: 10/10/20  2159     History Chief Complaint  Patient presents with  . Blurred Vision  . Migraine    Alyssa Lloyd is a 29 y.o. female with a history of migraines, herpes genitalis, chlamydia, Fitz-Hugh Curtis syndrome secondary to chlamydia, and PID who presents the emergency department with a chief with a headache.  The patient reports that she has been having a right-sided headache for the last 3 days.  She states that the headache is located over a lump on the right posterior side of her head that is painful.  Pain is worse when she palpates the area.  No other known aggravating or alleviating factors.  She reports associated blurred vision in her right eye since onset of the headache.  She characterizes the headache is stabbing and pressure-like.  She also noted that she has intermittently had some muffled hearing in her right ear.  No hearing loss.  No tinnitus, otalgia, neck pain or stiffness, fever, chills, numbness, weakness, slurred speech, facial droop, diplopia, amaurosis fugax, cough, chest pain, shortness of breath, abdominal pain, nausea, vomiting, diarrhea.  No recent trauma.  No history of similar.  She attempted to take Motrin and ibuprofen for her symptoms without improvement.    The history is provided by the patient. No language interpreter was used.       Past Medical History:  Diagnosis Date  . 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 HERPES GENITALIS 11/02/2010   Qualifier: Diagnosis of  By: Fabian Sharp MD, Neta Mends   . HSV (herpes simplex virus) anogenital infection   . PID (acute pelvic inflammatory disease) 01/11/2011    Patient Active Problem List   Diagnosis Date Noted  . Visit for preventive health examination 04/04/2015  . Encounter for routine gynecological examination 04/04/2015  . Hoarseness 04/04/2015  . Positive D dimer  11/28/2012  . Mild anemia 11/28/2012  . TMJ tenderness 03/30/2011  . Encounter for contraceptive management 01/22/2011  . Abdominal pain 01/08/2011  . IRREGULAR MENSES 08/24/2010  . HEMATURIA UNSPECIFIED 08/04/2010  . STRESS REACTION, ACUTE 07/04/2010  . HEADACHE 05/12/2008  . GOITER, UNSPECIFIED 09/11/2007  . G E REFLUX 09/11/2007    History reviewed. No pertinent surgical history.   OB History   No obstetric history on file.     Family History  Problem Relation Age of Onset  . Hypertension Other   . Gallbladder disease Mother   . Diabetes Other   . Asthma Other   . Anemia Other   . Allergies Other   . Migraines Other     Social History   Tobacco Use  . Smoking status: Never Smoker  . Smokeless tobacco: Never Used  Vaping Use  . Vaping Use: Never used  Substance Use Topics  . Alcohol use: No  . Drug use: No    Home Medications Prior to Admission medications   Medication Sig Start Date End Date Taking? Authorizing Provider  albuterol (VENTOLIN HFA) 108 (90 Base) MCG/ACT inhaler Inhale 1-2 puffs into the lungs every 6 (six) hours as needed for wheezing or shortness of breath. 02/08/20   Panosh, Neta Mends, MD  cetirizine (ZYRTEC) 10 MG tablet Take 10 mg by mouth daily.    [provider]  doxycycline (VIBRAMYCIN) 100 MG capsule Take 1 capsule (100 mg total) by mouth 2 (two) times daily. 08/08/20   Panosh, Neta Mends, MD  fluticasone Aleda Grana)  50 MCG/ACT nasal spray Place 2 sprays into both nostrils daily. 02/08/20   Panosh, Neta Mends, MD  ibuprofen (ADVIL,MOTRIN) 800 MG tablet Take 1 tablet (800 mg total) by mouth every 8 (eight) hours as needed for moderate pain. 09/02/18   Long, Arlyss Repress, MD  ketotifen (ZADITOR) 0.025 % ophthalmic solution Place 1 drop into both eyes 2 (two) times daily. For allergies 02/08/20   Panosh, Neta Mends, MD  montelukast (SINGULAIR) 10 MG tablet Take 1 tablet (10 mg total) by mouth at bedtime. Patient not taking: Reported on 07/24/2020 05/11/19    Panosh, Neta Mends, MD  Pseudoephedrine-APAP-DM (DAYQUIL MULTI-SYMPTOM COLD/FLU PO) Take 2 capsules by mouth every 6 (six) hours as needed (for flu symptoms.). Patient not taking: Reported on 07/24/2020    [provider]    Allergies    Doxycycline  Review of Systems   Review of Systems  Constitutional: Negative for activity change, chills and fever.  HENT: Negative for congestion, ear discharge, ear pain, sinus pressure, sinus pain and sore throat.   Eyes: Positive for visual disturbance. Negative for redness.  Respiratory: Negative for cough, choking and shortness of breath.   Cardiovascular: Negative for chest pain and palpitations.  Gastrointestinal: Negative for abdominal pain, blood in stool, diarrhea, nausea and vomiting.  Genitourinary: Negative for dysuria.  Musculoskeletal: Negative for arthralgias, back pain, myalgias, neck pain and neck stiffness.  Skin: Negative for rash and wound.  Allergic/Immunologic: Negative for immunocompromised state.  Neurological: Positive for headaches. Negative for dizziness, seizures, syncope, weakness and numbness.  Hematological: Positive for adenopathy.  Psychiatric/Behavioral: Negative for confusion.    Physical Exam Updated Vital Signs BP (!) 129/91   Pulse (!) 58   Temp 98.3 F (36.8 C) (Oral)   Resp 16   SpO2 97%   Physical Exam Vitals and nursing note reviewed.  Constitutional:      General: She is not in acute distress.    Appearance: Normal appearance. She is not ill-appearing, toxic-appearing or diaphoretic.  HENT:     Head: Normocephalic.      Comments: Single, recovery right occipital lymph node that is tender to palpation.  There is no overlying erythema, edema, or warmth.  No associated wounds or trauma.    Right Ear: Tympanic membrane, ear canal and external ear normal.     Left Ear: Tympanic membrane, ear canal and external ear normal.     Nose: Nose normal. No congestion or rhinorrhea.     Mouth/Throat:      Mouth: Mucous membranes are moist.     Pharynx: No oropharyngeal exudate or posterior oropharyngeal erythema.  Eyes:     General: No scleral icterus.       Right eye: No discharge.        Left eye: No discharge.     Extraocular Movements: Extraocular movements intact.     Conjunctiva/sclera: Conjunctivae normal.     Pupils: Pupils are equal, round, and reactive to light.  Neck:     Comments: No meningismus. Cardiovascular:     Rate and Rhythm: Normal rate and regular rhythm.     Heart sounds: No murmur heard.  No friction rub. No gallop.   Pulmonary:     Effort: Pulmonary effort is normal. No respiratory distress.     Breath sounds: No stridor. No wheezing, rhonchi or rales.  Chest:     Chest wall: No tenderness.  Abdominal:     General: There is no distension.     Palpations: Abdomen is  soft. There is no mass.     Tenderness: There is no abdominal tenderness. There is no right CVA tenderness, left CVA tenderness, guarding or rebound.     Hernia: No hernia is present.  Musculoskeletal:     Cervical back: Normal range of motion and neck supple.     Right lower leg: No edema.     Left lower leg: No edema.  Lymphadenopathy:     Head:     Right side of head: Occipital adenopathy present. No preauricular or posterior auricular adenopathy.     Left side of head: No preauricular, posterior auricular or occipital adenopathy.     Cervical:     Right cervical: No superficial, deep or posterior cervical adenopathy.    Left cervical: No superficial, deep or posterior cervical adenopathy.     Upper Body:     Right upper body: No supraclavicular adenopathy.     Left upper body: No supraclavicular adenopathy.     Comments: No supraclavicular, anterior posterior cervical, pre or postauricular lymph nodes noted on exam.  Skin:    General: Skin is warm.     Findings: No rash.  Neurological:     Mental Status: She is alert.     Sensory: No sensory deficit ().     Comments: Cranial nerves  II through XII are grossly intact.  No ataxia with ambulation.  4-5 strength against resistance of the bilateral upper and lower extremities.  Sensation is intact and equal throughout.  Psychiatric:        Behavior: Behavior normal.     ED Results / Procedures / Treatments   Labs (all labs ordered are listed, but only abnormal results are displayed) Labs Reviewed - No data to display  EKG None  Radiology No results found.  Procedures Procedures (including critical care time)  Medications Ordered in ED Medications  ketorolac (TORADOL) 30 MG/ML injection 30 mg (30 mg Intravenous Given 10/11/20 0234)  prochlorperazine (COMPAZINE) injection 10 mg (10 mg Intravenous Given 10/11/20 0233)  diphenhydrAMINE (BENADRYL) injection 12.5 mg (12.5 mg Intravenous Given 10/11/20 0232)  sodium chloride 0.9 % bolus 500 mL (0 mLs Intravenous Stopped 10/11/20 0348)    ED Course  I have reviewed the triage vital signs and the nursing notes.  Pertinent labs & imaging results that were available during my care of the patient were reviewed by me and considered in my medical decision making (see chart for details).    MDM Rules/Calculators/A&P                          29 year old female with a history of migraines, herpes genitalis, chlamydia, Fitz-Hugh Curtis syndrome secondary to chlamydia, and PID who presents the emergency department with a 3-day history of headache, blurry vision on the right eye, and a right occipital lymph node.   Vital signs are stable.  On exam, she has a single rubbery occipital lymph node that is tender to palpation.  No neurologic deficits.  Considered pseudotumor cerebri, meningitis, mastoiditis, AOM, acute bacterial rhinosinusitis as sources of the patient's symptoms.  She was given a migraine cocktail with Toradol, Compazine, Benadryl and on reevaluation her headache had resolved.  Blurred vision had also resolved.  She was asymptomatic at this time is feeling  significantly improved.  She was advised that she could resume Tylenol and Motrin for headache at home.  She is established with primary care and have recommended follow-up with lymph node does not resolve  within the next week.  ER return precautions given.  She is hemodynamically stable no acute distress.  Safe for discharge home with outpatient follow-up as indicated.  Final Clinical Impression(s) / ED Diagnoses Final diagnoses:  Bad headache  Occipital lymphadenopathy    Rx / DC Orders ED Discharge Orders    None       Dmarcus Decicco A, PA-C 10/11/20 0728    Melene Plan, DO 10/11/20 2258

## 2020-10-13 ENCOUNTER — Encounter: Payer: Self-pay | Admitting: Family Medicine

## 2020-10-13 ENCOUNTER — Other Ambulatory Visit: Payer: Self-pay

## 2020-10-13 ENCOUNTER — Ambulatory Visit (INDEPENDENT_AMBULATORY_CARE_PROVIDER_SITE_OTHER): Payer: Self-pay | Admitting: Family Medicine

## 2020-10-13 ENCOUNTER — Other Ambulatory Visit: Payer: Self-pay | Admitting: Family Medicine

## 2020-10-13 VITALS — BP 138/76 | HR 85 | Temp 98.7°F | Wt 244.0 lb

## 2020-10-13 DIAGNOSIS — H66002 Acute suppurative otitis media without spontaneous rupture of ear drum, left ear: Secondary | ICD-10-CM

## 2020-10-13 DIAGNOSIS — R519 Headache, unspecified: Secondary | ICD-10-CM

## 2020-10-13 DIAGNOSIS — E049 Nontoxic goiter, unspecified: Secondary | ICD-10-CM

## 2020-10-13 DIAGNOSIS — R5383 Other fatigue: Secondary | ICD-10-CM

## 2020-10-13 DIAGNOSIS — L2082 Flexural eczema: Secondary | ICD-10-CM

## 2020-10-13 DIAGNOSIS — R591 Generalized enlarged lymph nodes: Secondary | ICD-10-CM

## 2020-10-13 MED ORDER — AMOXICILLIN-POT CLAVULANATE 500-125 MG PO TABS: 1.0000 | ORAL_TABLET | Freq: Two times a day (BID) | ORAL | 0 refills | Status: DC

## 2020-10-13 MED FILL — AMOX-CLAV 500-125 MG TABLET: 500-125 | 7 days supply | Qty: 14 | Fill #0

## 2020-10-13 NOTE — Patient Instructions (Addendum)
Goiter  A goiter is an enlarged thyroid gland. The thyroid is located in the lower front of the neck. It makes hormones that affect many body parts and systems, including the system that affects how quickly the body burns fuel for energy (metabolism). Most goiters are painless and are not a cause for concern. Some goiters can affect the way your thyroid makes thyroid hormones. Goiters and conditions that cause goiters can be treated, if necessary. What are the causes? Common causes of this condition include:  Lack (deficiency) of a mineral called iodine. The thyroid gland uses iodine to make thyroid hormones.  Diseases that attack healthy cells in the body (autoimmune diseases) and affect thyroid function, such as Graves' disease or Hashimoto's disease. These diseases may cause the body to produce too much thyroid hormone (hyperthyroidism) or too little of the hormone (hypothyroidism).  Conditions that cause inflammation of the thyroid (thyroiditis).  One or more small growths on the thyroid (nodular goiter). Other causes include:  Medical problems caused by abnormal genes that are passed from parent to child (genetic defects).  Thyroid injury or infection.  Tumors that may or may not be cancerous.  Pregnancy.  Certain medicines.  Exposure to radiation. In some cases, the cause may not be known. What increases the risk? This condition is more likely to develop in:  People who do not get enough iodine in their diet.  People who have a family history of goiter.  Women.  People who are older than age 27.  People who smoke tobacco.  People who have had exposure to radiation. What are the signs or symptoms? The main symptom of this condition is swelling in the lower, front part of the neck. This swelling can range from a very small bump to a large lump. Other symptoms may include:  A tight feeling in the throat.  A hoarse voice.  Coughing.  Wheezing.  Difficulty  swallowing or breathing.  Bulging veins in the neck.  Dizziness. When a goiter is the result of an overactive thyroid (hyperthyroidism), symptoms may also include:  Nervousness or restlessness.  Inability to tolerate heat.  Unexplained weight loss.  Diarrhea.  Change in the texture of hair or skin.  Changes in heartbeat, such as skipped beats, extra beats, or a rapid heart rate.  Loss of menstruation.  Shaky hands.  Increased appetite.  Sleep problems. When a goiter is the result of an underactive thyroid (hypothyroidism), symptoms may also include:  Feeling like you have no energy (lethargy).  Inability to tolerate cold.  Weight gain that is not explained by a change in diet or exercise habits.  Dry skin.  Coarse hair.  Irregular menstrual periods.  Constipation.  Sadness or depression.  Fatigue. In some cases, there may not be any symptoms and the thyroid hormone levels may be normal. How is this diagnosed? This condition may be diagnosed based on your symptoms, your medical history, and a physical exam. You may have tests, such as:  Blood tests to check thyroid function.  Imaging tests, such as: ? Ultrasound. ? CT scan. ? MRI. ? Thyroid scan.  Removal of a tissue sample (biopsy) of the goiter or any nodules. The sample will be tested to check for cancer. How is this treated? Treatment for this condition depends on the cause and your symptoms. Treatment may include:  Medicines to regulate thyroid hormone levels.  Anti-inflammatory medicines or steroid medicines, if the goiter is caused by inflammation.  Iodine supplements or changes to your  diet, if the goiter is caused by iodine deficiency.  Radioactive iodine treatment.  Surgery to remove your thyroid. In some cases, you may only need regular check-ups with your health care provider to monitor your condition, and you may not need treatment. Follow these instructions at home:  Follow  instructions from your health care provider about any changes to your diet.  Take over-the-counter and prescription medicines only as told by your health care provider. These include supplements.  Do not use any products that contain nicotine or tobacco, such as cigarettes and e-cigarettes. If you need help quitting, ask your health care provider.  Keep all follow-up visits as told by your health care provider. This is important. Contact a health care provider if:  Your symptoms do not get better with treatment.  You have nausea, vomiting, or diarrhea. Get help right away if:  You have sudden, unexplained confusion or other mental changes.  You have a fever.  You have chest pain.  You have trouble breathing or swallowing.  You suddenly become very weak.  You experience extreme restlessness.  You feel your heart racing. Summary  A goiter is an enlarged thyroid gland.  The thyroid gland is located in the lower front of the neck. It makes hormones that affect many body parts and systems, including the system that affects how quickly the body burns fuel for energy (metabolism).  The main symptom of this condition is swelling in the lower, front part of the neck. This swelling can range from a very small bump to a large lump.  Treatment for this condition depends on the cause and your symptoms. You may need medicines, supplements, or regular monitoring of your condition. This information is not intended to replace advice given to you by your health care provider. Make sure you discuss any questions you have with your health care provider. Document Revised: 10/03/2017 Document Reviewed: 07/17/2017 Elsevier Patient Education  [place].   Otitis Media, Adult  Otitis media occurs when there is inflammation and fluid in the middle ear. Your middle ear is a part of the ear that contains bones for hearing as well as air that helps send sounds to your brain. What are the  causes? This condition is caused by a blockage in the eustachian tube. This tube drains fluid from the ear to the back of the nose (nasopharynx). A blockage in this tube can be caused by an object or by swelling (edema) in the tube. Problems that can cause a blockage include:  A cold or other upper respiratory infection.  Allergies.  An irritant, such as tobacco smoke.  Enlarged adenoids. The adenoids are areas of soft tissue located high in the back of the throat, behind the nose and the roof of the mouth.  A mass in the nasopharynx.  Damage to the ear caused by pressure changes (barotrauma). What are the signs or symptoms? Symptoms of this condition include:  Ear pain.  A fever.  Decreased hearing.  A headache.  Tiredness (lethargy).  Fluid leaking from the ear.  Ringing in the ear. How is this diagnosed? This condition is diagnosed with a physical exam. During the exam your health care provider will use an instrument called an otoscope to look into your ear and check for redness, swelling, and fluid. He or she will also ask about your symptoms. Your health care provider may also order tests, such as:  A test to check the movement of the eardrum (pneumatic otoscopy). This test  is done by squeezing a small amount of air into the ear.  A test that changes air pressure in the middle ear to check how well the eardrum moves and whether the eustachian tube is working (tympanogram). How is this treated? This condition usually goes away on its own within 3-5 days. But if the condition is caused by a bacteria infection and does not go away own its own, or keeps coming back, your health care provider may:  Prescribe antibiotic medicines to treat the infection.  Prescribe or recommend medicines to control pain. Follow these instructions at home:  Take over-the-counter and prescription medicines only as told by your health care provider.  If you were prescribed an antibiotic  medicine, take it as told by your health care provider. Do not stop taking the antibiotic even if you start to feel better.  Keep all follow-up visits as told by your health care provider. This is important. Contact a health care provider if:  You have bleeding from your nose.  There is a lump on your neck.  You are not getting better in 5 days.  You feel worse instead of better. Get help right away if:  You have severe pain that is not controlled with medicine.  You have swelling, redness, or pain around your ear.  You have stiffness in your neck.  A part of your face is paralyzed.  The bone behind your ear (mastoid) is tender when you touch it.  You develop a severe headache. Summary  Otitis media is redness, soreness, and swelling of the middle ear.  This condition usually goes away on its own within 3-5 days.  If the problem does not go away in 3-5 days, your health care provider may prescribe or recommend medicines to treat your symptoms.  If you were prescribed an antibiotic medicine, take it as told by your health care provider. This information is not intended to replace advice given to you by your health care provider. Make sure you discuss any questions you have with your health care provider. Document Revised: 10/03/2017 Document Reviewed: 10/11/2016 Elsevier Patient Education  [place] Headache Without Cause A headache is pain or discomfort felt around the head or neck area. The specific cause of a headache may not be found. There are many causes and types of headaches. A few common ones are:  Tension headaches.  Migraine headaches.  Cluster headaches.  Chronic daily headaches. Follow these instructions at home: Watch your condition for any changes. Let your health care provider know about them. Take these steps to help with your condition: Managing pain      Take over-the-counter and prescription medicines only as told by your  health care provider.  Lie down in a dark, quiet room when you have a headache.  If directed, put ice on your head and neck area: ? Put ice in a plastic bag. ? Place a towel between your skin and the bag. ? Leave the ice on for 20 minutes, 2-3 times per day.  If directed, apply heat to the affected area. Use the heat source that your health care provider recommends, such as a moist heat pack or a heating pad. ? Place a towel between your skin and the heat source. ? Leave the heat on for 20-30 minutes. ? Remove the heat if your skin turns bright red. This is especially important if you are unable to feel pain, heat, or cold. You may have a greater risk of  getting burned.  Keep lights dim if bright lights bother you or make your headaches worse. Eating and drinking  Eat meals on a regular schedule.  If you drink alcohol: ? Limit how much you use to:  0-1 drink a day for women.  0-2 drinks a day for men. ? Be aware of how much alcohol is in your drink. In the [place] one drink equals one 12 oz bottle of beer (355 mL), one 5 oz glass of wine (148 mL), or one 1 oz glass of hard liquor (44 mL).  Stop drinking caffeine, or decrease the amount of caffeine you drink. General instructions   Keep a headache journal to help find out what may trigger your headaches. For example, write down: ? What you eat and drink. ? How much sleep you get. ? Any change to your diet or medicines.  Try massage or other relaxation techniques.  Limit stress.  Sit up straight, and do not tense your muscles.  Do not use any products that contain nicotine or tobacco, such as cigarettes, e-cigarettes, and chewing tobacco. If you need help quitting, ask your health care provider.  Exercise regularly as told by your health care provider.  Sleep on a regular schedule. Get 7-9 hours of sleep each night, or the amount recommended by your health care provider.  Keep all follow-up visits as told by your health  care provider. This is important. Contact a health care provider if:  Your symptoms are not helped by medicine.  You have a headache that is different from the usual headache.  You have nausea or you vomit.  You have a fever. Get help right away if:  Your headache becomes severe quickly.  Your headache gets worse after moderate to intense physical activity.  You have repeated vomiting.  You have a stiff neck.  You have a loss of vision.  You have problems with speech.  You have pain in the eye or ear.  You have muscular weakness or loss of muscle control.  You lose your balance or have trouble walking.  You feel faint or pass out.  You have confusion.  You have a seizure. Summary  A headache is pain or discomfort felt around the head or neck area.  There are many causes and types of headaches. In some cases, the cause may not be found.  Keep a headache journal to help find out what may trigger your headaches. Watch your condition for any changes. Let your health care provider know about them.  Contact a health care provider if you have a headache that is different from the usual headache, or if your symptoms are not helped by medicine.  Get help right away if your headache becomes severe, you vomit, you have a loss of vision, you lose your balance, or you have a seizure. This information is not intended to replace advice given to you by your health care provider. Make sure you discuss any questions you have with your health care provider. Document Revised: 05/11/2018 Document Reviewed: 05/11/2018 Elsevier Patient Education  [place].  Fatigue If you have fatigue, you feel tired all the time and have a lack of energy or a lack of motivation. Fatigue may make it difficult to start or complete tasks because of exhaustion. In general, occasional or mild fatigue is often a normal response to activity or life. However, long-lasting (chronic) or extreme fatigue  may be a symptom of a medical condition. Follow these instructions at home:  General instructions  Watch your fatigue for any changes.  Go to bed and get up at the same time every day.  Avoid fatigue by pacing yourself during the day and getting enough sleep at night.  Maintain a healthy weight. Medicines  Take over-the-counter and prescription medicines only as told by your health care provider.  Take a multivitamin, if told by your health care provider.  Do not use herbal or dietary supplements unless they are approved by your health care provider. Activity   Exercise regularly, as told by your health care provider.  Use or practice techniques to help you relax, such as yoga, tai chi, meditation, or massage therapy. Eating and drinking   Avoid heavy meals in the evening.  Eat a well-balanced diet, which includes lean proteins, whole grains, plenty of fruits and vegetables, and low-fat dairy products.  Avoid consuming too much caffeine.  Avoid the use of alcohol.  Drink enough fluid to keep your urine pale yellow. Lifestyle  Change situations that cause you stress. Try to keep your work and personal schedule in balance.  Do not use any products that contain nicotine or tobacco, such as cigarettes and e-cigarettes. If you need help quitting, ask your health care provider.  Do not use drugs. Contact a health care provider if:  Your fatigue does not get better.  You have a fever.  You suddenly lose or gain weight.  You have headaches.  You have trouble falling asleep or sleeping through the night.  You feel angry, guilty, anxious, or sad.  You are unable to have a bowel movement (constipation).  Your skin is dry.  You have swelling in your legs or another part of your body. Get help right away if:  You feel confused.  Your vision is blurry.  You feel faint or you pass out.  You have a severe headache.  You have severe pain in your abdomen, your  back, or the area between your waist and hips (pelvis).  You have chest pain, shortness of breath, or an irregular or fast heartbeat.  You are unable to urinate, or you urinate less than normal.  You have abnormal bleeding, such as bleeding from the rectum, vagina, nose, lungs, or nipples.  You vomit blood.  You have thoughts about hurting yourself or others. If you ever feel like you may hurt yourself or others, or have thoughts about taking your own life, get help right away. You can go to your nearest emergency department or call:  Your local emergency services (911 in the [place]  A suicide crisis helpline, such as the [place] at 516-638-7171. This is open 24 hours a day. Summary  If you have fatigue, you feel tired all the time and have a lack of energy or a lack of motivation.  Fatigue may make it difficult to start or complete tasks because of exhaustion.  Long-lasting (chronic) or extreme fatigue may be a symptom of a medical condition.  Exercise regularly, as told by your health care provider.  Change situations that cause you stress. Try to keep your work and personal schedule in balance. This information is not intended to replace advice given to you by your health care provider. Make sure you discuss any questions you have with your health care provider. Document Revised: 05/12/2019 Document Reviewed: 07/16/2017 Elsevier Patient Education  [place].  Atopic Dermatitis Atopic dermatitis is a skin disorder that causes inflammation of the skin. This is the most common type  of eczema. Eczema is a group of skin conditions that cause the skin to be itchy, red, and swollen. This condition is generally worse during the cooler winter months and often improves during the warm summer months. Symptoms can vary from person to person. Atopic dermatitis usually starts showing signs in infancy and can last through adulthood. This condition cannot  be passed from one person to another (non-contagious), but it is more common in families. Atopic dermatitis may not always be present. When it is present, it is called a flare-up. What are the causes? The exact cause of this condition is not known. Flare-ups of the condition may be triggered by:  Contact with something that you are sensitive or allergic to.  Stress.  Certain foods.  Extremely hot or cold weather.  Harsh chemicals and soaps.  Dry air.  Chlorine. What increases the risk? This condition is more likely to develop in people who have a personal history or family history of eczema, allergies, asthma, or hay fever. What are the signs or symptoms? Symptoms of this condition include:  Dry, scaly skin.  Red, itchy rash.  Itchiness, which can be severe. This may occur before the skin rash. This can make sleeping difficult.  Skin thickening and cracking that can occur over time. How is this diagnosed? This condition is diagnosed based on your symptoms, a medical history, and a physical exam. How is this treated? There is no cure for this condition, but symptoms can usually be controlled. Treatment focuses on:  Controlling the itchiness and scratching. You may be given medicines, such as antihistamines or steroid creams.  Limiting exposure to things that you are sensitive or allergic to (allergens).  Recognizing situations that cause stress and developing a plan to manage stress. If your atopic dermatitis does not get better with medicines, or if it is all over your body (widespread), a treatment using a specific type of light (phototherapy) may be used. Follow these instructions at home: Skin care   Keep your skin well-moisturized. Doing this seals in moisture and helps to prevent dryness. ? Use unscented lotions that have petroleum in them. ? Avoid lotions that contain alcohol or water. They can dry the skin.  Keep baths or showers short (less than 5 minutes) in  warm water. Do not use hot water. ? Use mild, unscented cleansers for bathing. Avoid soap and bubble bath. ? Apply a moisturizer to your skin right after a bath or shower.  Do not apply anything to your skin without checking with your health care provider. General instructions  Dress in clothes made of cotton or cotton blends. Dress lightly because heat increases itchiness.  When washing your clothes, rinse your clothes twice so all of the soap is removed.  Avoid any triggers that can cause a flare-up.  Try to manage your stress.  Keep your fingernails cut short.  Avoid scratching. Scratching makes the rash and itchiness worse. It may also result in a skin infection (impetigo) due to a break in the skin caused by scratching.  Take or apply over-the-counter and prescription medicines only as told by your health care provider.  Keep all follow-up visits as told by your health care provider. This is important.  Do not be around people who have cold sores or fever blisters. If you get the infection, it may cause your atopic dermatitis to worsen. Contact a health care provider if:  Your itchiness interferes with sleep.  Your rash gets worse or it is not better  within one week of starting treatment.  You have a fever.  You have a rash flare-up after having contact with someone who has cold sores or fever blisters. Get help right away if:  You develop pus or soft yellow scabs in the rash area. Summary  This condition causes a red rash and itchy, dry, scaly skin.  Treatment focuses on controlling the itchiness and scratching, limiting exposure to things that you are sensitive or allergic to (allergens), recognizing situations that cause stress, and developing a plan to manage stress.  Keep your skin well-moisturized.  Keep baths or showers shorter than 5 minutes and use warm water. Do not use hot water. This information is not intended to replace advice given to you by your  health care provider. Make sure you discuss any questions you have with your health care provider. Document Revised: 02/09/2019 Document Reviewed: 11/22/2016 Elsevier Patient Education  [place].

## 2020-10-13 NOTE — Progress Notes (Signed)
Subjective:    Patient ID: Alyssa Lloyd, female    DOB: 01-23-1991, 29 y.o.   MRN: [id number]  No chief complaint on file.   HPI Pt is a 29 year old female with past medical history significant for HSV, PID, h/o Fitz-Hugh Curtis syndrome, obesity who was followed by Dr. Fabian Sharp and seen for acute concern.  Pt endorses bump posterior right head x1 week and headaches from occipital area to the right frontal area.  HAs started before bump noted.  Drinking half a gallon of water per day and 1 can of Pepsi.  Endorses decreased sleep, waking up to 2 hours.  Notes some stress last month while in the play, "[employer]": Seen in ED on 10/11/20 for HA, given a HA cocktail.  Pt endorses seasonal allergies, taking zyrtec and flonase daily.  Was on singulair, but d/c.'d.  Patient endorses dry skin/history of eczema, intermittent palpitations, changes in hair, weight gain with eating very little.  Patient denies being told she had a goiter.  Past Medical History:  Diagnosis Date  . 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 HERPES GENITALIS 11/02/2010   Qualifier: Diagnosis of  By: Fabian Sharp MD, Neta Mends   . HSV (herpes simplex virus) anogenital infection   . PID (acute pelvic inflammatory disease) 01/11/2011    Allergies  Allergen Reactions  . Doxycycline Hives    ROS General: Denies fever, chills, night sweats, +changes in weight, changes in appetite, seasonal allergies HEENT: Denies ear pain, changes in vision, rhinorrhea, sore throat  +HA CV: Denies CP, palpitations, SOB, orthopnea Pulm: Denies SOB, cough, wheezing GI: Denies abdominal pain, nausea, vomiting, diarrhea, constipation GU: Denies dysuria, hematuria, frequency, vaginal discharge Msk: Denies muscle cramps, joint pains Neuro: Denies weakness, numbness, tingling Skin: Denies rashes, bruising  + dry skin, eczema, bump in posterior right occipital area, birthmark right cheek Psych: Denies depression, anxiety,  hallucinations     Objective:    Blood pressure 138/76, pulse 85, temperature 98.7 F (37.1 C), temperature source Oral, weight 244 lb (110.7 kg), SpO2 99 %.   Gen. Pleasant, well-nourished, obese, in no distress, normal affect  HEENT: [place]/AT, face symmetric, conjunctiva clear, no scleral icterus, PERRLA, EOMI, nares patent without drainage, pharynx without erythema or exudate.  External ears and canals normal bilaterally.  Left TM full with mild erythema and suppurative fluid in upper right portion.  Right TM with mild erythema along the edges. Neck: No JVD, no carotid bruits.  No thyromegaly right lobe greater than left lobe.  No nodules noted.  Cervical lymphadenopathy and right occipital lymphadenopathy. Pulm: no accessory muscle use, CTAB, no wheezes or rales Cardiovascular: RRR, no m/r/g, no peripheral edema Musculoskeletal: No deformities, no cyanosis or clubbing, normal tone Neuro:  A&Ox3, CN II-XII intact, normal gait Skin:  Warm, dry, intact.  Mild acanthosis nigricans on posterior neck, hyperpigmented area on right cheek-birthmark   Wt Readings from Last 3 Encounters:  10/13/20 244 lb (110.7 kg)  08/08/20 238 lb 6.4 oz (108.1 kg)  07/24/20 238 lb 6.4 oz (108.1 kg)    Lab Results  Component Value Date   WBC 6.2 07/24/2020   HGB 11.3 (L) 07/24/2020   HCT 36.6 07/24/2020   PLT 377 07/24/2020   GLUCOSE 59 (L) 07/24/2020   CHOL 158 07/24/2020   TRIG 83 07/24/2020   HDL 46 (L) 07/24/2020   LDLCALC 95 07/24/2020   ALT 9 07/24/2020   AST 14 07/24/2020   NA 139 07/24/2020   K 3.8 07/24/2020  CL 105 07/24/2020   CREATININE 0.92 07/24/2020   BUN 11 07/24/2020   CO2 29 07/24/2020   TSH 1.25 07/24/2020   HGBA1C 5.3 07/24/2020    Assessment/Plan:  Non-recurrent acute suppurative otitis media of left ear without spontaneous rupture of tympanic membrane -Discussed supportive care -Allergies reviewed: doxycycline-hives - Plan: CBC with Differential/Platelet,  amoxicillin-clavulanate (AUGMENTIN) 500-125 MG tablet  Lymphadenopathy of head and neck -Likely 2/2 seasonal allergies, recent illness, current AOM, eczema -We will obtain labs -Continue to monitor for resolution upon completion of antibiotics for AOM - Plan: CBC with Differential/Platelet  Goiter -Per chart review TSH and free T4 done 07/24/2020 normal. -We will obtain repeat labs andthyroid ultrasound -Given handout - Plan: TSH, T4, free, US THYROID  Fatigue, unspecified type -Discussed possible causes including insomnia, thyroid dysfunction, viral illness, OSA -We will obtain labs -Given handout - Plan: TSH, T4, free, CBC with Differential/Platelet, Hemoglobin A1c, Vitamin D, 25-hydroxy  Flexural eczema -Stable -Discussed supportive care including avoiding hot showers, using a good moisturizing lotion -Topical steroids as needed -Given handout -Continue to monitor  Acute nonintractable headache, unspecified headache type -Discussed possible causes including stress, recent viral illness, seasonal allergies, insomnia -Discussed supportive care including decreasing p.o. intake of caffeine, getting plenty of rest, limiting stress -Consider further evaluation for continued or worsening headaches - Plan: CBC with Differential/Platelet, BASIC METABOLIC PANEL WITH GFR  F/u as needed with PCP  Abbe Amsterdam, MD

## 2020-10-14 LAB — BASIC METABOLIC PANEL WITH GFR
BUN: 9 mg/dL (ref 7–25)
CO2: 29 mmol/L (ref 20–32)
Calcium: 9.1 mg/dL (ref 8.6–10.2)
Chloride: 106 mmol/L (ref 98–110)
Creat: 0.84 mg/dL (ref 0.50–1.10)
GFR, Est African American: 107 mL/min/{1.73_m2} (ref >=60)
GFR, Est Non African American: 93 mL/min/{1.73_m2} (ref >=60)
Glucose, Bld: 83 mg/dL (ref 65–99)
Potassium: 4.5 mmol/L (ref 3.5–5.3)
Sodium: 141 mmol/L (ref 135–146)

## 2020-10-14 LAB — CBC WITH DIFFERENTIAL/PLATELET
Absolute Monocytes: 482 cells/uL (ref 200–950)
Basophils Absolute: 31 cells/uL (ref 0–200)
Basophils Relative: 0.5 %
Eosinophils Absolute: 750 cells/uL — ABNORMAL HIGH (ref 15–500)
Eosinophils Relative: 12.3 %
HCT: 37.7 % (ref 35.0–45.0)
Hemoglobin: 11.8 g/dL (ref 11.7–15.5)
Lymphs Abs: 1848 cells/uL (ref 850–3900)
MCH: 25.2 pg — ABNORMAL LOW (ref 27.0–33.0)
MCHC: 31.3 g/dL — ABNORMAL LOW (ref 32.0–36.0)
MCV: 80.4 fL (ref 80.0–100.0)
MPV: 10.7 fL (ref 7.5–12.5)
Monocytes Relative: 7.9 %
Neutro Abs: 2989 cells/uL (ref 1500–7800)
Neutrophils Relative %: 49 %
Platelets: 415 10*3/uL — ABNORMAL HIGH (ref 140–400)
RBC: 4.69 10*6/uL (ref 3.80–5.10)
RDW: 12.9 % (ref 11.0–15.0)
Total Lymphocyte: 30.3 %
WBC: 6.1 10*3/uL (ref 3.8–10.8)

## 2020-10-14 LAB — T4, FREE: Free T4: 1 ng/dL (ref 0.8–1.8)

## 2020-10-14 LAB — TSH: TSH: 1.16 mIU/L

## 2020-10-14 LAB — HEMOGLOBIN A1C
Hgb A1c MFr Bld: 5.1 % of total Hgb (ref <5.7)
Mean Plasma Glucose: 100 mg/dL
eAG (mmol/L): 5.5 mmol/L

## 2020-10-14 LAB — VITAMIN D 25 HYDROXY (VIT D DEFICIENCY, FRACTURES): Vit D, 25-Hydroxy: 20 ng/mL — ABNORMAL LOW (ref 30–100)

## 2020-10-16 ENCOUNTER — Other Ambulatory Visit: Payer: Self-pay | Admitting: Family Medicine

## 2020-10-16 DIAGNOSIS — E559 Vitamin D deficiency, unspecified: Secondary | ICD-10-CM

## 2020-10-16 MED ORDER — VITAMIN D (ERGOCALCIFEROL) 1.25 MG (50000 UNIT) PO CAPS
50000.0000 [IU] | ORAL_CAPSULE | ORAL | 0 refills | Status: DC
Start: 2020-10-16 — End: 2020-10-16

## 2020-10-16 MED FILL — VIT D2 1.25 MG (50,000 UNIT: 1.25 MG | 84 days supply | Qty: 12 | Fill #0

## 2020-10-16 NOTE — Telephone Encounter (Signed)
Pt seen by Dr. Salomon Fick on 12/10.

## 2020-10-17 ENCOUNTER — Telehealth: Payer: Self-pay | Admitting: Internal Medicine

## 2020-10-17 NOTE — Telephone Encounter (Signed)
Pt returning call and want a call back.

## 2020-10-23 NOTE — Telephone Encounter (Signed)
Reviewed lab results / recommendations with pt verbalized understanding

## 2020-10-30 ENCOUNTER — Encounter: Payer: Self-pay | Admitting: Internal Medicine

## 2020-10-30 ENCOUNTER — Ambulatory Visit (INDEPENDENT_AMBULATORY_CARE_PROVIDER_SITE_OTHER): Payer: Self-pay | Admitting: Internal Medicine

## 2020-10-30 ENCOUNTER — Other Ambulatory Visit: Payer: Self-pay

## 2020-10-30 VITALS — BP 142/88 | HR 67 | Temp 98.7°F | Ht 62.75 in | Wt 250.8 lb

## 2020-10-30 DIAGNOSIS — E049 Nontoxic goiter, unspecified: Secondary | ICD-10-CM

## 2020-10-30 DIAGNOSIS — R599 Enlarged lymph nodes, unspecified: Secondary | ICD-10-CM

## 2020-10-30 DIAGNOSIS — A530 Latent syphilis, unspecified as early or late: Secondary | ICD-10-CM

## 2020-10-30 DIAGNOSIS — R030 Elevated blood-pressure reading, without diagnosis of hypertension: Secondary | ICD-10-CM

## 2020-10-30 DIAGNOSIS — E559 Vitamin D deficiency, unspecified: Secondary | ICD-10-CM

## 2020-10-30 NOTE — Progress Notes (Signed)
Chief Complaint  Patient presents with  . discuss lab     Discuss past visit, and lab results, thyroid concerns, possible thyroid  swollen     HPI: Alyssa Lloyd 29 y.o. come in for fu   visit and ed for headache and right occipital tender node  Was noted to have goiter and labs done nltfts  Vit d low and US thyroid  Planned   bp runs in family not checking but had elevated blood pressure in the ED but no diagnosis of such. In regard to borderline RPR she did finish her antibiotic.   ROS: See pertinent positives and negatives per HPI.  No current chest pain shortness of breath is quite busy.  Past Medical History:  Diagnosis Date  . 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 HERPES GENITALIS 11/02/2010   Qualifier: Diagnosis of  By: Fabian Sharp MD, Neta Mends   . HSV (herpes simplex virus) anogenital infection   . PID (acute pelvic inflammatory disease) 01/11/2011    Family History  Problem Relation Age of Onset  . Hypertension Other   . Gallbladder disease Mother   . Diabetes Other   . Asthma Other   . Anemia Other   . Allergies Other   . Migraines Other     Social History   Socioeconomic History  . Marital status: Single    Spouse name: Not on file  . Number of children: Not on file  . Years of education: Not on file  . Highest education level: Not on file  Occupational History  . Not on file  Tobacco Use  . Smoking status: Never Smoker  . Smokeless tobacco: Never Used  Vaping Use  . Vaping Use: Never used  Substance and Sexual Activity  . Alcohol use: No  . Drug use: No  . Sexual activity: Not Currently  Other Topics Concern  . Not on file  Social History Narrative   ? Ets, in the past    Mom smoked and now has stopped this year.   Non smoker    Works [employer] recently separated and divorcing stressful.  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       Was  5 # 9 oz FT  gestation   Social Determinants of Health    Financial Resource Strain: Not on file  Food Insecurity: Not on file  Transportation Needs: Not on file  Physical Activity: Not on file  Stress: Not on file  Social Connections: Not on file    Outpatient Medications Prior to Visit  Medication Sig Dispense Refill  . albuterol (VENTOLIN HFA) 108 (90 Base) MCG/ACT inhaler Inhale 1-2 puffs into the lungs every 6 (six) hours as needed for wheezing or shortness of breath. 18 g 1  . cetirizine (ZYRTEC) 10 MG tablet Take 10 mg by mouth daily.    . fluticasone (FLONASE) 50 MCG/ACT nasal spray Place 2 sprays into both nostrils daily. 16 g 6  . ibuprofen (ADVIL,MOTRIN) 800 MG tablet Take 1 tablet (800 mg total) by mouth every 8 (eight) hours as needed for moderate pain. 21 tablet 0  . ketotifen (ZADITOR) 0.025 % ophthalmic solution Place 1 drop into both eyes 2 (two) times daily. For allergies (Patient not taking: Reported on 10/30/2020) 5 mL 0  . Vitamin D, Ergocalciferol, (DRISDOL) 1.25 MG (50000 UNIT) CAPS capsule Take 1 capsule (50,000 Units total) by mouth every 7 (seven) days. (Patient not taking: Reported on 10/30/2020)  12 capsule 0  . montelukast (SINGULAIR) 10 MG tablet Take 1 tablet (10 mg total) by mouth at bedtime. (Patient not taking: No sig reported) 30 tablet 3  . Pseudoephedrine-APAP-DM (DAYQUIL MULTI-SYMPTOM COLD/FLU PO) Take 2 capsules by mouth every 6 (six) hours as needed (for flu symptoms.). (Patient not taking: No sig reported)     No facility-administered medications prior to visit.     EXAM:  BP (!) 142/88 (BP Location: Left Arm)   Pulse 67   Temp 98.7 F (37.1 C) (Oral)   Ht 5' 2.75" (1.594 m)   Wt 250 lb 12.8 oz (113.8 kg)   LMP 10/02/2020   SpO2 96%   BMI 44.78 kg/m   Body mass index is 44.78 kg/m. Repeat blood pressure is 132/82 right arm sitting large cuff. GENERAL: vitals reviewed and listed above, alert, oriented, appears well hydrated and in no acute distress pleasant and cooperative. HEENT:  atraumatic, conjunctiva  clear, no obvious abnormalities on inspection of external nose and ears OP masked NECK: Thyroid easily palpable but no masses otherwise LUNGS: clear to auscultation bilaterally, no wheezes, rales or rhonchi, good air movement CV: HRRR, no clubbing cyanosis or  peripheral edema nl cap refill  Lymph nodes there is a shoddy less than 1 cm occipital node on the right nontender no scalp lesions are noted.  No other cervical adenopathy noted. MS: moves all extremities without noticeable focal  abnormality PSYCH: pleasant and cooperative, no obvious depression or anxiety Lab Results  Component Value Date   WBC 6.1 10/13/2020   HGB 11.8 10/13/2020   HCT 37.7 10/13/2020   PLT 415 (H) 10/13/2020   GLUCOSE 83 10/13/2020   CHOL 158 07/24/2020   TRIG 83 07/24/2020   HDL 46 (L) 07/24/2020   LDLCALC 95 07/24/2020   ALT 9 07/24/2020   AST 14 07/24/2020   NA 141 10/13/2020   K 4.5 10/13/2020   CL 106 10/13/2020   CREATININE 0.84 10/13/2020   BUN 9 10/13/2020   CO2 29 10/13/2020   TSH 1.16 10/13/2020   HGBA1C 5.1 10/13/2020   BP Readings from Last 3 Encounters:  10/30/20 (!) 142/88  10/13/20 138/76  10/11/20 (!) 129/91    ASSESSMENT AND PLAN:  Discussed the following assessment and plan:  Reactive lymphadenopathy - Right occipital resolving - Plan: VITAMIN D 25 Hydroxy (Vit-D Deficiency, Fractures), RPR, Fluorescent treponemal ab(fta)-IgG-bld  Goiter - Plan: VITAMIN D 25 Hydroxy (Vit-D Deficiency, Fractures), RPR, Fluorescent treponemal ab(fta)-IgG-bld  Vitamin D deficiency - Level was 20 now on supplementation. - Plan: VITAMIN D 25 Hydroxy (Vit-D Deficiency, Fractures), RPR, Fluorescent treponemal ab(fta)-IgG-bld  Elevated blood pressure reading - With family history see text needs follow-up - Plan: VITAMIN D 25 Hydroxy (Vit-D Deficiency, Fractures), RPR, Fluorescent treponemal ab(fta)-IgG-bld  Morbid obesity (HCC) - Plan: VITAMIN D 25 Hydroxy (Vit-D Deficiency,  Fractures), RPR, Fluorescent treponemal ab(fta)-IgG-bld  Positive RPR test - Plan: VITAMIN D 25 Hydroxy (Vit-D Deficiency, Fractures), RPR, Fluorescent treponemal ab(fta)-IgG-bld Low vitamin D resolving reactive adenopathy occipital right related to headache Elevated blood pressure reading needs more reading documentation family history intervene with lifestyle consider medicine if appropriate She will proceed with thyroid ultrasound. Plan follow-up laboratory studies in March to include RPR serology vitamin D.  -Patient advised to return or notify health care team  if  new concerns arise.  Patient Instructions  repeat  BP was 132/82 still slightly high   Take blood pressure readings twice a day for 5-7 days and record .  Take 2 -3 readings at each sitting .  Can send in readings  by My Chart.    Before checking your blood pressure make sure: You are seated and quite for 5 min before checking Feet are flat on the floor Siting in chair with your back supported straight up and down Arm resting on table or arm of chair at heart level Bladder is empty You have NOT had caffeine or tobacco within the last 30 min  [email or web address] Dash eating   Can help BP readings.  If not controlled we may need to add medication to help. BP.   The nodule   Seems like a   Reaction   To infection but is getting better and  Now  Exam is reassuring.   Get the ultrasound of the thyroid   Plan fu labs in March 2022       Burna Mortimer K. Aeva Posey M.D.

## 2020-10-30 NOTE — Patient Instructions (Signed)
repeat  BP was 132/82 still slightly high   Take blood pressure readings twice a day for 5-7 days and record .     Take 2 -3 readings at each sitting .  Can send in readings  by My Chart.    Before checking your blood pressure make sure: You are seated and quite for 5 min before checking Feet are flat on the floor Siting in chair with your back supported straight up and down Arm resting on table or arm of chair at heart level Bladder is empty You have NOT had caffeine or tobacco within the last 30 min  [email or web address] Dash eating   Can help BP readings.  If not controlled we may need to add medication to help. BP.   The nodule   Seems like a   Reaction   To infection but is getting better and  Now  Exam is reassuring.   Get the ultrasound of the thyroid   Plan fu labs in March 2022

## 2020-11-08 ENCOUNTER — Other Ambulatory Visit: Payer: Self-pay | Admitting: Internal Medicine

## 2020-11-08 MED ORDER — AMLODIPINE BESYLATE 2.5 MG PO TABS: 2.5000 mg | ORAL_TABLET | Freq: Every day | ORAL | 2 refills | Status: DC

## 2020-11-08 NOTE — Telephone Encounter (Signed)
Hi Morissa. Thank you for the blood pressure readings. I think you should continue working on lifestyle intervention such as exercise lowering sodium in your diet and working on healthy weight loss. However because these blood pressure readings have probably been up for a while would add medication in addition.  To help lower the blood pressure to a healthier safer range. The goal is 120/80 or thereabouts.    We will send in blood pressure medication amlodipine to take 1 pill a day.   Plan follow-up visit either virtual or in person in about 6 weeks on the new medicine to see if we are in blood pressure control.  Please make a follow-up appointment.  Do not forget to get your thyroid ultrasound when possible.

## 2020-11-16 MED FILL — VIT D2 1.25 MG (50,000 UNIT: 1.25 MG | 84 days supply | Qty: 12 | Fill #0

## 2020-11-16 MED FILL — AMLODIPINE BESYLATE 2.5 MG: 2.5 | 30 days supply | Qty: 30 | Fill #0

## 2020-12-04 ENCOUNTER — Ambulatory Visit
Admission: RE | Admit: 2020-12-04 | Discharge: 2020-12-04 | Disposition: A | Discharge status: Home / Self Care | Payer: Medicaid Other | Source: Ambulatory Visit | Attending: Family Medicine | Admitting: Family Medicine

## 2020-12-04 DIAGNOSIS — E049 Nontoxic goiter, unspecified: Secondary | ICD-10-CM

## 2020-12-05 NOTE — Progress Notes (Signed)
Thyroid  ultrasound was good  minor nodules . Follow clinical exam at yearly visits or as needed

## 2020-12-21 MED FILL — AMLODIPINE BESYLATE 2.5 MG: 2.5 | 30 days supply | Qty: 30 | Fill #1

## 2021-01-29 ENCOUNTER — Ambulatory Visit: Payer: Medicaid Other | Admitting: Internal Medicine

## 2021-01-29 NOTE — Progress Notes (Deleted)
No chief complaint on file.   HPI: Alyssa Lloyd 30 y.o. come in for fu  Borderline  Pos fta anmd  Mild anemia   bp elevation ROS: See pertinent positives and negatives per HPI.  Past Medical History:  Diagnosis Date  . 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 HERPES GENITALIS 11/02/2010   Qualifier: Diagnosis of  By: Fabian Sharp MD, Neta Mends   . HSV (herpes simplex virus) anogenital infection   . PID (acute pelvic inflammatory disease) 01/11/2011    Family History  Problem Relation Age of Onset  . Hypertension Other   . Gallbladder disease Mother   . Diabetes Other   . Asthma Other   . Anemia Other   . Allergies Other   . Migraines Other     Social History   Socioeconomic History  . Marital status: Single    Spouse name: Not on file  . Number of children: Not on file  . Years of education: Not on file  . Highest education level: Not on file  Occupational History  . Not on file  Tobacco Use  . Smoking status: Never Smoker  . Smokeless tobacco: Never Used  Vaping Use  . Vaping Use: Never used  Substance and Sexual Activity  . Alcohol use: No  . Drug use: No  . Sexual activity: Not Currently  Other Topics Concern  . Not on file  Social History Narrative   ? Ets, in the past    Mom smoked and now has stopped this year.   Non smoker    Works [employer] recently separated and divorcing stressful.  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       Was  5 # 9 oz FT  gestation   Social Determinants of Health   Financial Resource Strain: Not on file  Food Insecurity: Not on file  Transportation Needs: Not on file  Physical Activity: Not on file  Stress: Not on file  Social Connections: Not on file    Outpatient Medications Prior to Visit  Medication Sig Dispense Refill  . albuterol (VENTOLIN HFA) 108 (90 Base) MCG/ACT inhaler Inhale 1-2 puffs into the lungs every 6 (six) hours as needed for wheezing or shortness of  breath. 18 g 1  . amLODipine (NORVASC) 2.5 MG tablet Take 1 tablet (2.5 mg total) by mouth daily. For elevated Blood pressure 30 tablet 2  . cetirizine (ZYRTEC) 10 MG tablet Take 10 mg by mouth daily.    . fluticasone (FLONASE) 50 MCG/ACT nasal spray Place 2 sprays into both nostrils daily. 16 g 6  . ibuprofen (ADVIL,MOTRIN) 800 MG tablet Take 1 tablet (800 mg total) by mouth every 8 (eight) hours as needed for moderate pain. 21 tablet 0  . ketotifen (ZADITOR) 0.025 % ophthalmic solution Place 1 drop into both eyes 2 (two) times daily. For allergies (Patient not taking: Reported on 10/30/2020) 5 mL 0  . Vitamin D, Ergocalciferol, (DRISDOL) 1.25 MG (50000 UNIT) CAPS capsule Take 1 capsule (50,000 Units total) by mouth every 7 (seven) days. (Patient not taking: Reported on 10/30/2020) 12 capsule 0   No facility-administered medications prior to visit.     EXAM:  There were no vitals taken for this visit.  There is no height or weight on file to calculate BMI. Wt Readings from Last 3 Encounters:  10/30/20 250 lb 12.8 oz (113.8 kg)  10/13/20 244 lb (110.7 kg)  08/08/20 238 lb 6.4  oz (108.1 kg)    GENERAL: vitals reviewed and listed above, alert, oriented, appears well hydrated and in no acute distress HEENT: atraumatic, conjunctiva  clear, no obvious abnormalities on inspection of external nose and ears OP : no lesion edema or exudate  NECK: no obvious masses on inspection palpation  LUNGS: clear to auscultation bilaterally, no wheezes, rales or rhonchi, good air movement CV: HRRR, no clubbing cyanosis or  peripheral edema nl cap refill  MS: moves all extremities without noticeable focal  abnormality PSYCH: pleasant and cooperative, no obvious depression or anxiety Lab Results  Component Value Date   WBC 6.1 10/13/2020   HGB 11.8 10/13/2020   HCT 37.7 10/13/2020   PLT 415 (H) 10/13/2020   GLUCOSE 83 10/13/2020   CHOL 158 07/24/2020   TRIG 83 07/24/2020   HDL 46 (L) 07/24/2020    LDLCALC 95 07/24/2020   ALT 9 07/24/2020   AST 14 07/24/2020   NA 141 10/13/2020   K 4.5 10/13/2020   CL 106 10/13/2020   CREATININE 0.84 10/13/2020   BUN 9 10/13/2020   CO2 29 10/13/2020   TSH 1.16 10/13/2020   HGBA1C 5.1 10/13/2020   BP Readings from Last 3 Encounters:  10/30/20 (!) 142/88  10/13/20 138/76  10/11/20 (!) 129/91    ASSESSMENT AND PLAN:  Discussed the following assessment and plan:  No diagnosis found. Due vit d level and rpr  -Patient advised to return or notify health care team  if  new concerns arise.  There are no Patient Instructions on file for this visit.   Neta Mends. Tipton Ballow M.D.

## 2021-02-01 MED FILL — AMLODIPINE 2.5 MG TABLET: 2.5 | 30 days supply | Qty: 30 | Fill #2

## 2021-02-05 ENCOUNTER — Encounter: Payer: Self-pay | Admitting: Internal Medicine

## 2021-02-05 ENCOUNTER — Telehealth (INDEPENDENT_AMBULATORY_CARE_PROVIDER_SITE_OTHER): Payer: Self-pay | Admitting: Internal Medicine

## 2021-02-05 ENCOUNTER — Other Ambulatory Visit (HOSPITAL_COMMUNITY): Payer: Self-pay

## 2021-02-05 DIAGNOSIS — B9789 Other viral agents as the cause of diseases classified elsewhere: Secondary | ICD-10-CM

## 2021-02-05 DIAGNOSIS — J988 Other specified respiratory disorders: Secondary | ICD-10-CM

## 2021-02-05 MED ORDER — PROMETHAZINE-DM 6.25-15 MG/5ML PO SYRP
5.0000 mL | ORAL_SOLUTION | Freq: Four times a day (QID) | ORAL | 0 refills | Status: DC | PRN
  Filled 2021-02-05: qty 118, 6d supply, fill #0

## 2021-02-05 NOTE — Progress Notes (Signed)
Virtual Visit via Video Note  I connected with@ on 02/05/21 at  3:00 PM EDT by a video enabled telemedicine application and verified that I am speaking with the correct person using two identifiers. Location patient: home Location provider:work  office Persons participating in the virtual visit: patient, provider  [place] national recommendations  regarding COVID 19 pandemic   video visit is advised over in office visit for this patient.  Patient aware  of the limitations of evaluation and management by telemedicine and  availability of in person appointments. and agreed to proceed.   HPI: Alyssa Lloyd presents for video visit for acute illness.  She had the onset of congestion and cough and then feeling hot and cold felt to have a fever over the last 3 days.  Cough is become more as is nasal congestion she had taken multiple over-the-counter medicines see above. He denies any chest pain shortness of breath hemoptysis. No serious pain that is persistent. 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  No specific exposures otherwise.  She has done a home test for Covid which was negative.   ROS: See pertinent positives and negatives per HPI.  No sore throat with this  Past Medical History:  Diagnosis Date  . 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 HERPES GENITALIS 11/02/2010   Qualifier: Diagnosis of  By: Fabian Sharp MD, Neta Mends   . HSV (herpes simplex virus) anogenital infection   . PID (acute pelvic inflammatory disease) 01/11/2011    History reviewed. No pertinent surgical history.  Family History  Problem Relation Age of Onset  . Hypertension Other   . Gallbladder disease Mother   . Diabetes Other   . Asthma Other   . Anemia Other   . Allergies Other   . Migraines Other     Social History   Tobacco Use  . Smoking status: Never Smoker  . Smokeless  tobacco: Never Used  Vaping Use  . Vaping Use: Never used  Substance Use Topics  . Alcohol use: No  . Drug use: No      Current Outpatient Medications:  .  albuterol (VENTOLIN HFA) 108 (90 Base) MCG/ACT inhaler, INHALE 1 - 2 PUFFS INTO THE LUNGS EVERY 6 HOURS AS NEEDED FOR WHEEZING OR SHORTNESS OF BREATH, Disp: 18 g, Rfl: 1 .  amLODipine (NORVASC) 2.5 MG tablet, TAKE 1 TABLET BY MOUTH ONCE DAILY FOR ELEVATED BLOOD PRESSURE., Disp: 30 tablet, Rfl: 2 .  cetirizine (ZYRTEC) 10 MG tablet, Take 10 mg by mouth daily., Disp: , Rfl:  .  fluticasone (FLONASE) 50 MCG/ACT nasal spray, Place 2 sprays into both nostrils daily., Disp: 16 g, Rfl: 6 .  ibuprofen (ADVIL,MOTRIN) 800 MG tablet, Take 1 tablet (800 mg total) by mouth every 8 (eight) hours as needed for moderate pain., Disp: 21 tablet, Rfl: 0 .  ketotifen (ZADITOR) 0.025 % ophthalmic solution, Place 1 drop into both eyes 2 (two) times daily. For allergies, Disp: 5 mL, Rfl: 0 .  promethazine-dextromethorphan (PROMETHAZINE-DM) 6.25-15 MG/5ML syrup, Take 5 mLs by mouth 4 (four) times daily as needed for cough (at night)., Disp: 118 mL, Rfl: 0 .  Vitamin D, Ergocalciferol, (DRISDOL) 1.25 MG (50000 UNIT) CAPS capsule, TAKE 1 CAPSULE (50,000 UNITS TOTAL) BY MOUTH EVERY 7 DAYS., Disp: 12 capsule, Rfl: 0  EXAM: BP Readings from Last 3 Encounters:  10/30/20 (!) 142/88  10/13/20 138/76  10/11/20 Marland Kitchen)  129/91    VITALS per patient if applicable:  GENERAL: alert, oriented, appears well and in no acute distress she has some obvious congestion and occasional bronchial cough.  HEENT: atraumatic, conjunttiva clear, no obvious abnormalities on inspection of external nose and ears  NECK: normal movements of the head and neck  LUNGS: on inspection no signs of respiratory distress, breathing rate appears normal, no obvious gross SOB, gasping or wheezing  CV: no obvious cyanosis  MS: moves all visible extremities without noticeable  abnormality  PSYCH/NEURO: pleasant and cooperative, no obvious depression or anxiety, speech and thought processing grossly intact Lab Results  Component Value Date   WBC 6.1 10/13/2020   HGB 11.8 10/13/2020   HCT 37.7 10/13/2020   PLT 415 (H) 10/13/2020   GLUCOSE 83 10/13/2020   CHOL 158 07/24/2020   TRIG 83 07/24/2020   HDL 46 (L) 07/24/2020   LDLCALC 95 07/24/2020   ALT 9 07/24/2020   AST 14 07/24/2020   NA 141 10/13/2020   K 4.5 10/13/2020   CL 106 10/13/2020   CREATININE 0.84 10/13/2020   BUN 9 10/13/2020   CO2 29 10/13/2020   TSH 1.16 10/13/2020   HGBA1C 5.1 10/13/2020    ASSESSMENT AND PLAN:  Discussed the following assessment and plan:    ICD-10-CM   1. Viral respiratory infection  J98.8    B97.89    Acute viral respiratory infection exposure to young children in large groups.  No alarming findings. Relative rest fluids antipyretics as needed we will send in cough medicine can use Mucinex in the day. Note for work can try cough medicine at night precautions. Even though her rapid Covid was negative suggest PCR testing for planning to go back to work. Otherwise were right message to go back on April 7 if improved and no fever. Counseled.   Expectant management and discussion of plan and treatment with opportunity to ask questions and all were answered. The patient agreed with the plan and demonstrated an understanding of the instructions.   Advised to call back or seek an in-person evaluation if worsening  or having  further concerns . Return if symptoms worsen or fail to improve as expected.Berniece Andreas, MD

## 2021-02-16 ENCOUNTER — Other Ambulatory Visit (HOSPITAL_COMMUNITY): Payer: Self-pay

## 2021-03-05 ENCOUNTER — Other Ambulatory Visit (HOSPITAL_COMMUNITY): Payer: Self-pay

## 2021-03-05 ENCOUNTER — Telehealth (INDEPENDENT_AMBULATORY_CARE_PROVIDER_SITE_OTHER): Payer: Medicaid Other | Admitting: Internal Medicine

## 2021-03-05 ENCOUNTER — Encounter: Payer: Self-pay | Admitting: Internal Medicine

## 2021-03-05 DIAGNOSIS — R0789 Other chest pain: Secondary | ICD-10-CM

## 2021-03-05 DIAGNOSIS — J329 Chronic sinusitis, unspecified: Secondary | ICD-10-CM

## 2021-03-05 DIAGNOSIS — J069 Acute upper respiratory infection, unspecified: Secondary | ICD-10-CM

## 2021-03-05 MED ORDER — BENZONATATE 100 MG PO CAPS
ORAL_CAPSULE | ORAL | 0 refills | Status: DC
  Filled 2021-03-05: qty 30, 10d supply, fill #0

## 2021-03-05 MED ORDER — AMOXICILLIN-POT CLAVULANATE 875-125 MG PO TABS
1.0000 | ORAL_TABLET | Freq: Two times a day (BID) | ORAL | 0 refills | Status: DC
Start: 2021-03-05 — End: 2021-07-24
  Filled 2021-03-05: qty 14, 7d supply, fill #0

## 2021-03-05 MED ORDER — ALBUTEROL SULFATE HFA 108 (90 BASE) MCG/ACT IN AERS
INHALATION_SPRAY | RESPIRATORY_TRACT | 1 refills | Status: DC
  Filled 2021-03-05: qty 18, 18d supply, fill #0
  Filled 2022-01-21: qty 18, 25d supply, fill #1

## 2021-03-05 MED ORDER — METHYLPREDNISOLONE 4 MG PO TBPK
ORAL_TABLET | ORAL | 0 refills | Status: DC
  Filled 2021-03-05: qty 21, 6d supply, fill #0

## 2021-03-05 NOTE — Progress Notes (Signed)
Virtual Visit via Video Note  I connected with@ on 03/05/21 at  8:30 AM EDT by a video enabled telemedicine application and verified that I am speaking with the correct person using two identifiers. Location patient: home Location provider:work office Persons participating in the virtual visit: patient, provider  [place] national recommendations  regarding COVID 19 pandemic   video visit is advised over in office visit for this patient.  Patient aware  of the limitations of evaluation and management by telemedicine and  availability of in person appointments. and agreed to proceed.   HPI: Alyssa Lloyd presents for video visit for recurring respiratory symptoms. See last visit beginning of April felt to be a viral respiratory infection. Since then she had improved but had a residual cough and then over the last 3 to 4 days has it "all back again". Upper respiratory congestion now thick nasal green drainage wheezy cough but no known fever.  Some left upper chest discomfort not pleuritic is tender to touch.  She did get COVID tested 2 days ago and evaluated at [facility] required. COVID test was negative chest was reported as clear to auscultation but was given a Medrol Dosepak that she has not picked up yet. She is at work today feels pretty dragged out but no fever or chills. Face pressure congestion has used albuterol with minimal help for tightness.   ROS: See pertinent positives and negatives per HPI.  Past Medical History:  Diagnosis Date  . 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 HERPES GENITALIS 11/02/2010   Qualifier: Diagnosis of  By: Fabian Sharp MD, Neta Mends   . HSV (herpes simplex virus) anogenital infection   . PID (acute pelvic inflammatory disease) 01/11/2011    History reviewed. No pertinent surgical history.  Family History  Problem Relation Age of Onset  . Hypertension Other   . Gallbladder disease Mother   . Diabetes Other   . Asthma Other    . Anemia Other   . Allergies Other   . Migraines Other     Social History   Tobacco Use  . Smoking status: Never Smoker  . Smokeless tobacco: Never Used  Vaping Use  . Vaping Use: Never used  Substance Use Topics  . Alcohol use: No  . Drug use: No      Current Outpatient Medications:  .  amLODipine (NORVASC) 2.5 MG tablet, TAKE 1 TABLET BY MOUTH ONCE DAILY FOR ELEVATED BLOOD PRESSURE., Disp: 30 tablet, Rfl: 2 .  amoxicillin-clavulanate (AUGMENTIN) 875-125 MG tablet, Take 1 tablet by mouth every 12 (twelve) hours., Disp: 14 tablet, Rfl: 0 .  benzonatate (TESSALON) 100 MG capsule, Take one  capsule by mouth 3 times a day as needed. Do not break, chew, dissolve, cut or crush., Disp: 30 capsule, Rfl: 0 .  cetirizine (ZYRTEC) 10 MG tablet, Take 10 mg by mouth daily., Disp: , Rfl:  .  fluticasone (FLONASE) 50 MCG/ACT nasal spray, Place 2 sprays into both nostrils daily., Disp: 16 g, Rfl: 6 .  ibuprofen (ADVIL,MOTRIN) 800 MG tablet, Take 1 tablet (800 mg total) by mouth every 8 (eight) hours as needed for moderate pain., Disp: 21 tablet, Rfl: 0 .  ketotifen (ZADITOR) 0.025 % ophthalmic solution, Place 1 drop into both eyes 2 (two) times daily. For allergies, Disp: 5 mL, Rfl: 0 .  methylPREDNISolone (MEDROL DOSEPAK) 4 MG TBPK tablet, follow package directions, Disp: 21 tablet, Rfl: 0 .  promethazine-dextromethorphan (PROMETHAZINE-DM) 6.25-15 MG/5ML syrup, Take 5 mLs by mouth 4 (  four) times daily as needed for cough (at night)., Disp: 118 mL, Rfl: 0 .  Vitamin D, Ergocalciferol, (DRISDOL) 1.25 MG (50000 UNIT) CAPS capsule, TAKE 1 CAPSULE (50,000 UNITS TOTAL) BY MOUTH EVERY 7 DAYS., Disp: 12 capsule, Rfl: 0 .  albuterol (VENTOLIN HFA) 108 (90 Base) MCG/ACT inhaler, INHALE 1 TO 2 PUFFS INTO THE LUNGS EVERY 6 HOURS AS NEEDED FOR WHEEZING OR SHORTNESS OF BREATH, Disp: 18 g, Rfl: 1  EXAM: BP Readings from Last 3 Encounters:  10/30/20 (!) 142/88  10/13/20 138/76  10/11/20 (!) 129/91     VITALS per patient if applicable:  GENERAL: alert, oriented, appears well and in no acute distress appears to be upper respiratory congested not dyspneic normal speech nontoxic with occasional bronchial cough.  HEENT: atraumatic, conjunttiva clear, no obvious abnormalities on inspection of external nose and ears  NECK: normal movements of the head and neck  LUNGS: on inspection no signs of respiratory distress, breathing rate appears normal, no obvious gross SOB, gasping or wheezing  CV: no obvious cyanosis  MS: moves all visible extremities without noticeable abnormality  PSYCH/NEURO: pleasant and cooperative, no obvious depression or anxiety, speech and thought processing grossly intact Lab Results  Component Value Date   WBC 6.1 10/13/2020   HGB 11.8 10/13/2020   HCT 37.7 10/13/2020   PLT 415 (H) 10/13/2020   GLUCOSE 83 10/13/2020   CHOL 158 07/24/2020   TRIG 83 07/24/2020   HDL 46 (L) 07/24/2020   LDLCALC 95 07/24/2020   ALT 9 07/24/2020   AST 14 07/24/2020   NA 141 10/13/2020   K 4.5 10/13/2020   CL 106 10/13/2020   CREATININE 0.84 10/13/2020   BUN 9 10/13/2020   CO2 29 10/13/2020   TSH 1.16 10/13/2020   HGBA1C 5.1 10/13/2020    ASSESSMENT AND PLAN:  Discussed the following assessment and plan:    ICD-10-CM   1. Sinusitis, unspecified chronicity, unspecified location  J32.9   2. Upper respiratory tract infection, unspecified type  J06.9    Possible wheezy bronchitis.  3. Chest discomfort  R07.89    Possibly chest wall no other alarm symptoms COVID-negative   Uncertain if relapsing symptoms versus new viral exposure. Nevertheless would probably benefit from antibiotic based on symptom complex. Reported clear chest exam a few days ago and COVID-negative.  She does have an asthmatic tendency with allergy. Add antibiotic Augmentin refill her albuterol if having chest wheezing tightness can start the steroid risk-benefit discussed.  Aside asks about  follow-up labs labs are orders are in the system vitamin D RPR FTA from December she can get a lab appointment at any time but would probably wait until improved in this infection.  Counseled.   Expectant management and discussion of plan and treatment with opportunity to ask questions and all were answered. The patient agreed with the plan and demonstrated an understanding of the instructions.   Advised to call back or seek an in-person evaluation if worsening  or having  further concerns . Return if symptoms worsen or fail to improve as expected, for Lab when convenient and improved.    Berniece Andreas, MD

## 2021-04-09 ENCOUNTER — Telehealth: Payer: Self-pay | Admitting: Internal Medicine

## 2021-04-09 MED ORDER — AMLODIPINE BESYLATE 2.5 MG PO TABS: ORAL_TABLET | ORAL | 2 refills | Status: DC

## 2021-04-09 NOTE — Telephone Encounter (Signed)
Patient is calling and requesting a refill for amLODipine (NORVASC) 2.5 MG tablet to go to go [facility] on [address]. This is the only medication patient is requesting to be filled at this location. CB is [phone]

## 2021-04-09 NOTE — Telephone Encounter (Signed)
RX sent to patient pharmacy.   

## 2021-05-15 ENCOUNTER — Encounter: Payer: Self-pay | Admitting: Internal Medicine

## 2021-05-15 ENCOUNTER — Telehealth (INDEPENDENT_AMBULATORY_CARE_PROVIDER_SITE_OTHER): Payer: Medicaid Other | Admitting: Internal Medicine

## 2021-05-15 VITALS — Ht 62.75 in | Wt 250.8 lb

## 2021-05-15 DIAGNOSIS — N946 Dysmenorrhea, unspecified: Secondary | ICD-10-CM

## 2021-05-15 DIAGNOSIS — R1084 Generalized abdominal pain: Secondary | ICD-10-CM

## 2021-05-15 DIAGNOSIS — R6883 Chills (without fever): Secondary | ICD-10-CM

## 2021-05-15 NOTE — Progress Notes (Signed)
Virtual Visit via Video Note  I connected withNAME@ on 05/15/21 at  3:00 PM EDT by a video enabled telemedicine application and verified that I am speaking with the correct person using two identifiers. Location patient: vehicle  Location provider:work  office Persons participating in the virtual visit: patient, provider  [place] national recommendations  regarding COVID 19 pandemic   video visit is advised over in office visit for this patient.  Patient aware  of the limitations of evaluation and management by telemedicine and  availability of in person appointments. and agreed to proceed.   HPI: Alyssa Lloyd presents for video visit  onset Sunday   2 days ago  and had bad cramps  first  all over stomach  began cycle yesterday and with nl cramps in addition.   Nausea and chills  but no fever   heavy period  otherwise normal. No diarrhea or constipation.    The rapid COVID test 2 days at the onset of symptoms no known exposure Had COVID last April no immunization No one sick at home or exposures that she is aware ROS: See pertinent positives and negatives per HPI.  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 No pertinent surgical history.  Family History  Problem Relation Age of Onset   Hypertension Other    Gallbladder disease Mother    Diabetes Other    Asthma Other    Anemia Other    Allergies Other    Migraines Other     Social History   Tobacco Use   Smoking status: Never   Smokeless tobacco: Never  Vaping Use   Vaping Use: Never used  Substance Use Topics   Alcohol use: No   Drug use: No      Current Outpatient Medications:    albuterol (VENTOLIN HFA) 108  (90 Base) MCG/ACT inhaler, INHALE 1 TO 2 PUFFS INTO THE LUNGS EVERY 6 HOURS AS NEEDED FOR WHEEZING OR SHORTNESS OF BREATH, Disp: 18 g, Rfl: 1   amLODipine (NORVASC) 2.5 MG tablet, TAKE 1 TABLET BY MOUTH ONCE DAILY FOR ELEVATED BLOOD PRESSURE., Disp: 30 tablet, Rfl: 2   amoxicillin-clavulanate (AUGMENTIN) 875-125 MG tablet, Take 1 tablet by mouth every 12 (twelve) hours., Disp: 14 tablet, Rfl: 0   benzonatate (TESSALON) 100 MG capsule, Take 1 capsule by mouth 3 times a day as needed. Do not break, chew, dissolve, cut or crush., Disp: 30 capsule, Rfl: 0   cetirizine (ZYRTEC) 10 MG tablet, Take 10 mg by mouth daily., Disp: , Rfl:    fluticasone (FLONASE) 50 MCG/ACT nasal spray, Place 2 sprays into both nostrils daily., Disp: 16 g, Rfl: 6   ibuprofen (ADVIL,MOTRIN) 800 MG tablet, Take 1 tablet (800 mg total) by mouth every 8 (eight) hours as needed for moderate pain., Disp: 21 tablet, Rfl: 0   ketotifen (ZADITOR) 0.025 % ophthalmic solution, Place 1 drop into both eyes 2 (two) times daily. For allergies, Disp: 5 mL, Rfl: 0   methylPREDNISolone (MEDROL DOSEPAK) 4 MG TBPK tablet, follow package directions, Disp: 21 tablet, Rfl: 0   promethazine-dextromethorphan (PROMETHAZINE-DM) 6.25-15 MG/5ML syrup, Take 5 mLs by mouth 4 (four) times daily as needed for cough (at night)., Disp:  118 mL, Rfl: 0   Vitamin D, Ergocalciferol, (DRISDOL) 1.25 MG (50000 UNIT) CAPS capsule, TAKE 1 CAPSULE (50,000 UNITS TOTAL) BY MOUTH EVERY 7 DAYS., Disp: 12 capsule, Rfl: 0  EXAM: BP Readings from Last 3 Encounters:  10/30/20 (!) 142/88  10/13/20 138/76  10/11/20 (!) 129/91    VITALS per patient if applicable:  GENERAL: alert, oriented, appears well and in no acute distress  HEENT: atraumatic, conjunttiva clear, no obvious abnormalities on inspection of external nose and ears  NECK: normal movements of the head and neck  LUNGS: on inspection no signs of respiratory distress, breathing rate appears normal, no obvious  gross SOB, gasping or wheezing  CV: no obvious cyanosis  MS: moves all visible extremities without noticeable abnormality  PSYCH/NEURO: pleasant and cooperative, no obvious depression or anxiety, speech and thought processing grossly intact Lab Results  Component Value Date   WBC 6.1 10/13/2020   HGB 11.8 10/13/2020   HCT 37.7 10/13/2020   PLT 415 (H) 10/13/2020   GLUCOSE 83 10/13/2020   CHOL 158 07/24/2020   TRIG 83 07/24/2020   HDL 46 (L) 07/24/2020   LDLCALC 95 07/24/2020   ALT 9 07/24/2020   AST 14 07/24/2020   NA 141 10/13/2020   K 4.5 10/13/2020   CL 106 10/13/2020   CREATININE 0.84 10/13/2020   BUN 9 10/13/2020   CO2 29 10/13/2020   TSH 1.16 10/13/2020   HGBA1C 5.1 10/13/2020    ASSESSMENT AND PLAN:  Discussed the following assessment and plan:    ICD-10-CM   1. Generalized abdominal cramps  R10.84     2. Dysmenorrhea  N94.6    primary.     3. Chills (without fever)  R68.83      Suspect  acute gi illness   now with  nl dysmenorrhea     sx rx light eating and liquids , retest for covid  If  persistent or progressive   maybe more labs  s  and in person visit .  Close observation advised   Remote hx of pid sx  10 years   doesn't seem to be related to that  Should get the fu lab in system  Counseled.   Expectant management and discussion of plan and treatment with opportunity to ask questions and all were answered. The patient agreed with the plan and demonstrated an understanding of the instructions.   Advised to call back or seek an in-person evaluation if worsening  or having  further concerns . Return if symptoms worsen or fail to improve as expected.   Berniece Andreas, MD

## 2021-07-24 ENCOUNTER — Encounter: Payer: Self-pay | Admitting: Family Medicine

## 2021-07-24 ENCOUNTER — Telehealth (INDEPENDENT_AMBULATORY_CARE_PROVIDER_SITE_OTHER): Payer: Self-pay | Admitting: Family Medicine

## 2021-07-24 DIAGNOSIS — R0981 Nasal congestion: Secondary | ICD-10-CM

## 2021-07-24 DIAGNOSIS — R059 Cough, unspecified: Secondary | ICD-10-CM

## 2021-07-24 MED ORDER — BENZONATATE 200 MG PO CAPS
200.0000 mg | ORAL_CAPSULE | Freq: Two times a day (BID) | ORAL | 0 refills | Status: DC | PRN
  Filled 2021-07-24: qty 20, 10d supply, fill #0

## 2021-07-24 NOTE — Patient Instructions (Addendum)
   ---------------------------------------------------------------------------------------------------------------------------      WORK SLIP:  Patient Alyssa Lloyd,  December 20, 1990, was seen for a medical visit today, 07/24/21 . Please excuse from work for a COVID like illness. IF the covid test is positive we advise 10 days minimum from the onset of symptoms (07/21/21) PLUS 1 day of no fever and improved symptoms. Will defer to employer for a sooner return to work if the covid test is negative and symptoms have improved, OR, it is greater than 5 days since the positive test, symptoms have improved and the patient can wear a high-quality, tight fitting mask such as N95 or KN95 at all times for an additional 5 days. Would also suggest COVID19 antigen testing is negative prior to return.  Sincerely: E-signature: Dr. Kriste Basque, DO [facility] Ph: [phone]   ------------------------------------------------------------------------------------------------------------------------------    HOME CARE TIPS:  -COVID19 testing information:  Most pharmacies also offer testing and home test kits.  -I sent the medication(s) we discussed to your pharmacy: Meds ordered this encounter  Medications   benzonatate (TESSALON) 200 MG capsule    Sig: Take 1 capsule (200 mg total) by mouth 2 (two) times daily as needed for cough.    Dispense:  20 capsule    Refill:  0     -can use tylenol or aleve if needed for fevers, aches and pains per instructions  -can use nasal saline a few times per day if you have nasal congestion; sometimes  a short course of Afrin nasal spray for 3 days can help with symptoms as well  -stay hydrated, drink plenty of fluids and eat small healthy meals - avoid dairy  -can take 1000 IU ( ) Vit D3 and 100-500 mg of Vit C daily per instructions  -If the Covid test is positive, check out the [facility] website for more information on home care,  transmission and treatment for COVID19  -follow up with your doctor in 2-3 days unless improving and feeling better  -stay home while sick, except to seek medical care. If you have COVID19, ideally it would be best to stay home for a full 10 days since the onset of symptoms PLUS one day of no fever and feeling better. Wear a good mask that fits snugly (such as N95 or KN95) if around others to reduce the risk of transmission.  It was nice to meet you today, and I really hope you are feeling better soon. I help [facility] out with telemedicine visits on Tuesdays and Thursdays and am available for visits on those days. If you have any concerns or questions following this visit please schedule a follow up visit with your Primary Care doctor or seek care at a local urgent care clinic to avoid delays in care.    Seek in person care or schedule a follow up video visit promptly if your symptoms worsen, new concerns arise or you are not improving with treatment. Call 911 and/or seek emergency care if your symptoms are severe or life threatening.

## 2021-07-24 NOTE — Progress Notes (Signed)
Virtual Visit via Video Note  I connected with Alyssa Lloyd  on 07/24/21 at  6:00 PM EDT by a video enabled telemedicine application and verified that I am speaking with the correct person using two identifiers.  Location patient: home, [place] Location provider:work or home office Persons participating in the virtual visit: patient, provider  I discussed the limitations of evaluation and management by telemedicine and the availability of in person appointments. The patient expressed understanding and agreed to proceed.   HPI:  Acute telemedicine visit for cough and congestion: -Onset: 3 days ago -did a covid test initially negative -Symptoms include:nasal congestion, cough, body aches, feels tired -Denies:fever, CP, SOB, vomiting, diarrhea, inability to eat/drink/get out bed -Pertinent past medical history:see below -Pertinent medication allergies: Allergies  Allergen Reactions   Doxycycline Hives  -COVID-19 vaccine status: not vaccinated -denies any chance of pregnancy  ROS: See pertinent positives and negatives per HPI.  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 No pertinent surgical history.   Current Outpatient Medications:    albuterol (VENTOLIN HFA) 108 (90 Base) MCG/ACT inhaler, INHALE 1 TO 2 PUFFS INTO THE LUNGS EVERY 6 HOURS AS NEEDED FOR WHEEZING OR SHORTNESS OF BREATH, Disp: 18 g, Rfl: 1   amLODipine (NORVASC) 2.5 MG tablet, TAKE 1 TABLET BY MOUTH ONCE DAILY FOR ELEVATED BLOOD PRESSURE., Disp: 30 tablet, Rfl: 2   benzonatate (TESSALON) 200 MG capsule, Take 1 capsule (200 mg total) by mouth 2 (two) times daily as needed for cough., Disp: 20 capsule, Rfl: 0   cetirizine (ZYRTEC) 10 MG tablet, Take 10 mg by  mouth daily., Disp: , Rfl:    ibuprofen (ADVIL,MOTRIN) 800 MG tablet, Take 1 tablet (800 mg total) by mouth every 8 (eight) hours as needed for moderate pain., Disp: 21 tablet, Rfl: 0  EXAM:  VITALS per patient if applicable:  GENERAL: alert, oriented, appears well and in no acute distress  HEENT: atraumatic, conjunttiva clear, no obvious abnormalities on inspection of external nose and ears  NECK: normal movements of the head and neck  LUNGS: on inspection no signs of respiratory distress, breathing rate appears normal, no obvious gross SOB, gasping or wheezing  CV: no obvious cyanosis  MS: moves all visible extremities without noticeable abnormality  PSYCH/NEURO: pleasant and cooperative, no obvious depression or anxiety, speech and thought processing grossly intact  ASSESSMENT AND PLAN:  Discussed the following assessment and plan:  Cough  Nasal congestion  -we discussed possible serious and likely etiologies, options for evaluation and workup, limitations of telemedicine visit vs in person visit, treatment, treatment risks and precautions. Pt is agreeable to treatment via telemedicine at this moment. Query VURI, [id number] with false neg early testing, influenza vs other. She opted to do repeat covid testing, also sent tessalon for cough and other symptomatic care measures are summarized in patient instructions.  Work/School slipped offered: provided in patient instructions    Advised to seek prompt in person care if worsening, new symptoms arise, or if is not improving with treatment. Discussed options for inperson care if PCP office not available.    I discussed the assessment and treatment plan with the patient. The patient was provided an opportunity to ask questions and all were answered. The patient agreed  with the plan and demonstrated an understanding of the instructions.     Lucretia Kern, DO

## 2021-07-25 ENCOUNTER — Other Ambulatory Visit (HOSPITAL_COMMUNITY): Payer: Self-pay

## 2021-07-25 MED ORDER — AMOXICILLIN 500 MG PO CAPS
ORAL_CAPSULE | ORAL | 0 refills | Status: DC
  Filled 2021-07-25: qty 21, 7d supply, fill #0

## 2021-07-28 ENCOUNTER — Other Ambulatory Visit (HOSPITAL_COMMUNITY): Payer: Self-pay

## 2021-09-16 NOTE — Progress Notes (Signed)
Chief Complaint  Patient presents with   Fatigue    Patient complains of faigue, x2 weeks     HPI: Alyssa Lloyd 30 y.o. come in for   fatigue even if sleeps but averages  5 hours   has had 3 jobs since August.  More tired than usual. Does admit to when sitting will doze off sometimes when the kids are quiet in the classroom she may doze.  No history of sleep apnea snoring respiratory difficulty. Wants to make sure this fatigue is not a medical  problem and what she can do about it. 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 ROS: See pertinent positives and negatives per HPI. ? Depression  mood. .  Not hopeless.  Working 3 jobs for financial reasons. Periods shorter  3-4 days  LMP now   Ran out of bp med  weeks  ago  had been regular at that time.  And  readings were in 120/80 range  Takes Zyrtec regularly no history of fatigue with that. 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   HERPES GENITALIS 11/02/2010   Qualifier: Diagnosis of  By: Fabian Sharp MD, Neta Mends    HSV (herpes simplex virus) anogenital infection    PID (acute pelvic inflammatory disease) 01/11/2011    Family History  Problem Relation Age of Onset   Hypertension Other    Gallbladder disease Mother    Diabetes Other    Asthma Other    Anemia Other    Allergies Other    Migraines Other     Social History   Socioeconomic History   Marital status: Single    Spouse name: Not on file   Number of children: Not on file   Years of education: Not on file   Highest education level: Not on file  Occupational History   Not on file  Tobacco Use   Smoking status: Never   Smokeless tobacco: Never  Vaping Use   Vaping Use: Never used  Substance and Sexual Activity   Alcohol use: No   Drug use: No   Sexual activity: Not Currently  Other  Topics Concern   Not on file  Social History Narrative   ? Ets, in the past    Mom smoked and now has stopped this year.   Non smoker    Works [employer] recently separated and divorcing stressful.  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       Was  5 # 9 oz FT  gestation   Social Determinants of Health   Financial Resource Strain: Not on file  Food Insecurity: Not on file  Transportation Needs: Not on file  Physical Activity: Not on file  Stress: Not on file  Social Connections: Not on file    Outpatient Medications Prior to Visit  Medication Sig Dispense Refill   albuterol (VENTOLIN HFA) 108 (90 Base) MCG/ACT inhaler INHALE 1 TO 2 PUFFS INTO THE LUNGS EVERY 6 HOURS AS NEEDED FOR WHEEZING OR SHORTNESS OF BREATH 18 g 1   amoxicillin (AMOXIL) 500 MG capsule Take one capsule by mouth every 8 hours until gone 21 capsule 0   benzonatate (TESSALON) 200 MG capsule Take 1 capsule (200 mg total) by mouth 2 (  two) times daily as needed for cough. 20 capsule 0   cetirizine (ZYRTEC) 10 MG tablet Take 10 mg by mouth daily.     ibuprofen (ADVIL,MOTRIN) 800 MG tablet Take 1 tablet (800 mg total) by mouth every 8 (eight) hours as needed for moderate pain. 21 tablet 0   amLODipine (NORVASC) 2.5 MG tablet TAKE 1 TABLET BY MOUTH ONCE DAILY FOR ELEVATED BLOOD PRESSURE. 30 tablet 2   No facility-administered medications prior to visit.     EXAM:  BP (!) 150/96 (BP Location: Left Arm, Patient Position: Sitting, Cuff Size: Normal)   Pulse 66   Temp 98.2 F (36.8 C) (Oral)   Ht 5' 2.08" (1.577 m)   Wt 248 lb 3.2 oz (112.6 kg)   LMP 09/14/2021   SpO2 98%   BMI 45.29 kg/m   Body mass index is 45.29 kg/m. Wt Readings from Last 3 Encounters:  09/17/21 248 lb 3.2 oz (112.6 kg)  05/15/21 250 lb 12.8 oz (113.8 kg)  10/30/20 250 lb 12.8 oz (113.8 kg)    GENERAL: vitals reviewed and listed above, alert, oriented, appears well hydrated and in no acute distress HEENT: atraumatic,  conjunctiva  clear, no obvious abnormalities on inspection of external nose and ears OP : masked  NECK: no obvious masses on inspection thyroid palpable  LUNGS: clear to auscultation bilaterally, no wheezes, rales or rhonchi, good air movement CV: HRRR, no clubbing cyanosis or  peripheral edema nl cap refill  Abdomen:  Sof,t normal bowel sounds without hepatosplenomegaly, no guarding rebound or masses no CVA tenderness  MS: moves all extremities without noticeable focal  abnormality PSYCH: pleasant and cooperative, no obvious depression or anxiety Lab Results  Component Value Date   WBC 6.1 10/13/2020   HGB 11.8 10/13/2020   HCT 37.7 10/13/2020   PLT 415 (H) 10/13/2020   GLUCOSE 83 10/13/2020   CHOL 158 07/24/2020   TRIG 83 07/24/2020   HDL 46 (L) 07/24/2020   LDLCALC 95 07/24/2020   ALT 9 07/24/2020   AST 14 07/24/2020   NA 141 10/13/2020   K 4.5 10/13/2020   CL 106 10/13/2020   CREATININE 0.84 10/13/2020   BUN 9 10/13/2020   CO2 29 10/13/2020   TSH 1.16 10/13/2020   HGBA1C 5.1 10/13/2020   BP Readings from Last 3 Encounters:  09/17/21 (!) 150/96  10/30/20 (!) 142/88  10/13/20 138/76  See depression screen  ASSESSMENT AND PLAN:  Discussed the following assessment and plan:  Tired - Plan: Basic metabolic panel, CBC with Differential/Platelet, Hemoglobin A1c, Hepatic function panel, TSH, T4, free, T3, VITAMIN D 25 Hydroxy (Vit-D Deficiency, Fractures), RPR, Fluorescent treponemal ab(fta)-IgG-bld, HIV Antibody (routine testing w rflx), HIV Antibody (routine testing w rflx), Fluorescent treponemal ab(fta)-IgG-bld, RPR, VITAMIN D 25 Hydroxy (Vit-D Deficiency, Fractures), T3, T4, free, TSH, Hepatic function panel, Hemoglobin A1c, CBC with Differential/Platelet, Basic metabolic panel  Sleep deprivation  Medication management - Plan: Basic metabolic panel, CBC with Differential/Platelet, Hemoglobin A1c, Hepatic function panel, TSH, T4, free, T3, VITAMIN D 25 Hydroxy (Vit-D  Deficiency, Fractures), RPR, Fluorescent treponemal ab(fta)-IgG-bld, HIV Antibody (routine testing w rflx), HIV Antibody (routine testing w rflx), Fluorescent treponemal ab(fta)-IgG-bld, RPR, VITAMIN D 25 Hydroxy (Vit-D Deficiency, Fractures), T3, T4, free, TSH, Hepatic function panel, Hemoglobin A1c, CBC with Differential/Platelet, Basic metabolic panel  Essential hypertension - ran out of med seemed to get Bp to goal  - Plan: Basic metabolic panel, CBC with Differential/Platelet, Hemoglobin A1c, Hepatic function panel, TSH, T4, free, T3, VITAMIN D  25 Hydroxy (Vit-D Deficiency, Fractures), RPR, Fluorescent treponemal ab(fta)-IgG-bld, HIV Antibody (routine testing w rflx), HIV Antibody (routine testing w rflx), Fluorescent treponemal ab(fta)-IgG-bld, RPR, VITAMIN D 25 Hydroxy (Vit-D Deficiency, Fractures), T3, T4, free, TSH, Hepatic function panel, Hemoglobin A1c, CBC with Differential/Platelet, Basic metabolic panel  Vitamin D deficiency - Plan: Basic metabolic panel, CBC with Differential/Platelet, Hemoglobin A1c, Hepatic function panel, TSH, T4, free, T3, VITAMIN D 25 Hydroxy (Vit-D Deficiency, Fractures), RPR, Fluorescent treponemal ab(fta)-IgG-bld, HIV Antibody (routine testing w rflx), HIV Antibody (routine testing w rflx), Fluorescent treponemal ab(fta)-IgG-bld, RPR, VITAMIN D 25 Hydroxy (Vit-D Deficiency, Fractures), T3, T4, free, TSH, Hepatic function panel, Hemoglobin A1c, CBC with Differential/Platelet, Basic metabolic panel  History of RPR test - do fu labs today  had been rx  doxy and 1/2 dose of bicillin borderline test  - Plan: Basic metabolic panel, CBC with Differential/Platelet, Hemoglobin A1c, Hepatic function panel, TSH, T4, free, T3, VITAMIN D 25 Hydroxy (Vit-D Deficiency, Fractures), RPR, Fluorescent treponemal ab(fta)-IgG-bld, HIV Antibody (routine testing w rflx), HIV Antibody (routine testing w rflx), Fluorescent treponemal ab(fta)-IgG-bld, RPR, VITAMIN D 25 Hydroxy (Vit-D Deficiency,  Fractures), T3, T4, free, TSH, Hepatic function panel, Hemoglobin A1c, CBC with Differential/Platelet, Basic metabolic panel  Severe obesity (BMI >= 40) (HCC) Plan follow-up status after lab work and 10 to 14 days of 6 more sleep Consider other eval if indicated. -Patient advised to return or notify health care team  if  new concerns arise.  Patient Instructions  Lab monitoring  You are sleep deprived and could be adding to sx .  Refill amlodipine. For now  to get goal BP   120 /80 range .   Checking for anemia thyroid other causes of fatigue.   Plan follow up depending  Can you get at least 6 hours 7 is best   for 10 - 14 days to see how  you feel also.    Neta Mends. Jahanna Raether M.D.

## 2021-09-17 ENCOUNTER — Encounter: Payer: Self-pay | Admitting: Internal Medicine

## 2021-09-17 ENCOUNTER — Ambulatory Visit (INDEPENDENT_AMBULATORY_CARE_PROVIDER_SITE_OTHER): Payer: Self-pay | Admitting: Internal Medicine

## 2021-09-17 VITALS — BP 150/96 | HR 66 | Temp 98.2°F | Ht 62.08 in | Wt 248.2 lb

## 2021-09-17 DIAGNOSIS — I10 Essential (primary) hypertension: Secondary | ICD-10-CM

## 2021-09-17 DIAGNOSIS — A530 Latent syphilis, unspecified as early or late: Secondary | ICD-10-CM

## 2021-09-17 DIAGNOSIS — R768 Other specified abnormal immunological findings in serum: Secondary | ICD-10-CM

## 2021-09-17 DIAGNOSIS — Z79899 Other long term (current) drug therapy: Secondary | ICD-10-CM

## 2021-09-17 DIAGNOSIS — Z72820 Sleep deprivation: Secondary | ICD-10-CM

## 2021-09-17 DIAGNOSIS — R5383 Other fatigue: Secondary | ICD-10-CM

## 2021-09-17 DIAGNOSIS — E559 Vitamin D deficiency, unspecified: Secondary | ICD-10-CM

## 2021-09-17 DIAGNOSIS — Z9289 Personal history of other medical treatment: Secondary | ICD-10-CM

## 2021-09-17 LAB — HEPATIC FUNCTION PANEL
ALT: 10 U/L (ref 0–35)
AST: 16 U/L (ref 0–37)
Albumin: 4.5 g/dL (ref 3.5–5.2)
Alkaline Phosphatase: 72 U/L (ref 39–117)
Bilirubin, Direct: 0.1 mg/dL (ref 0.0–0.3)
Total Bilirubin: 0.4 mg/dL (ref 0.2–1.2)
Total Protein: 8.1 g/dL (ref 6.0–8.3)

## 2021-09-17 LAB — CBC WITH DIFFERENTIAL/PLATELET
Basophils Absolute: 0 10*3/uL (ref 0.0–0.1)
Basophils Relative: 0.6 % (ref 0.0–3.0)
Eosinophils Absolute: 0.5 10*3/uL (ref 0.0–0.7)
Eosinophils Relative: 10.2 % — ABNORMAL HIGH (ref 0.0–5.0)
HCT: 37.2 % (ref 36.0–46.0)
Hemoglobin: 11.7 g/dL — ABNORMAL LOW (ref 12.0–15.0)
Lymphocytes Relative: 31.3 % (ref 12.0–46.0)
Lymphs Abs: 1.7 10*3/uL (ref 0.7–4.0)
MCHC: 31.5 g/dL (ref 30.0–36.0)
MCV: 77.6 fl — ABNORMAL LOW (ref 78.0–100.0)
Monocytes Absolute: 0.4 10*3/uL (ref 0.1–1.0)
Monocytes Relative: 7.5 % (ref 3.0–12.0)
Neutro Abs: 2.7 10*3/uL (ref 1.4–7.7)
Neutrophils Relative %: 50.4 % (ref 43.0–77.0)
Platelets: 426 10*3/uL — ABNORMAL HIGH (ref 150.0–400.0)
RBC: 4.79 Mil/uL (ref 3.87–5.11)
RDW: 14.8 % (ref 11.5–15.5)
WBC: 5.3 10*3/uL (ref 4.0–10.5)

## 2021-09-17 LAB — T4, FREE: Free T4: 0.76 ng/dL (ref 0.60–1.60)

## 2021-09-17 LAB — BASIC METABOLIC PANEL
BUN: 14 mg/dL (ref 6–23)
CO2: 26 mEq/L (ref 19–32)
Calcium: 9.3 mg/dL (ref 8.4–10.5)
Chloride: 105 mEq/L (ref 96–112)
Creatinine, Ser: 0.85 mg/dL (ref 0.40–1.20)
GFR: 90.9 mL/min (ref >60.00)
Glucose, Bld: 84 mg/dL (ref 70–99)
Potassium: 3.7 mEq/L (ref 3.5–5.1)
Sodium: 139 mEq/L (ref 135–145)

## 2021-09-17 LAB — VITAMIN D 25 HYDROXY (VIT D DEFICIENCY, FRACTURES): VITD: 14.2 ng/mL — ABNORMAL LOW (ref 30.00–100.00)

## 2021-09-17 LAB — HEMOGLOBIN A1C: Hgb A1c MFr Bld: 5.7 % (ref 4.6–6.5)

## 2021-09-17 LAB — TSH: TSH: 1.54 u[IU]/mL (ref 0.35–5.50)

## 2021-09-17 MED ORDER — AMLODIPINE BESYLATE 2.5 MG PO TABS: ORAL_TABLET | ORAL | 3 refills | Status: DC

## 2021-09-17 NOTE — Patient Instructions (Signed)
Lab monitoring  You are sleep deprived and could be adding to sx .  Refill amlodipine. For now  to get goal BP   120 /80 range .   Checking for anemia thyroid other causes of fatigue.   Plan follow up depending  Can you get at least 6 hours 7 is best   for 10 - 14 days to see how  you feel also.

## 2021-09-21 LAB — RPR: RPR Ser Ql: REACTIVE — AB

## 2021-09-21 LAB — T3: T3, Total: 112 ng/dL (ref 76–181)

## 2021-09-21 LAB — FLUORESCENT TREPONEMAL AB(FTA)-IGG-BLD: Fluorescent Treponemal ABS: REACTIVE — AB

## 2021-09-21 LAB — HIV ANTIBODY (ROUTINE TESTING W REFLEX): HIV 1&2 Ab, 4th Generation: NONREACTIVE

## 2021-09-21 LAB — RPR TITER: RPR Titer: 1:2 {titer} — ABNORMAL HIGH

## 2021-09-24 ENCOUNTER — Telehealth: Payer: Self-pay | Admitting: Internal Medicine

## 2021-09-24 NOTE — Telephone Encounter (Signed)
[facility]a [facility] and pt has dx of positive syphilis and [name] would like to know what md would like to do. Pt testes positive on 07-24-2020 and then again on 09-17-2021

## 2021-09-29 IMAGING — US US THYROID
1 series · 14 of 25 positions shown · non-contrast
Comparison: None.

CLINICAL DATA: Goiter, palpitations, weight gain

EXAM:
THYROID ULTRASOUND
TECHNIQUE: Ultrasound examination of the thyroid gland and adjacent soft
tissues was performed.

[Series 1: us thyroid · 0.08mm/px · 14 of 44 slices shown]
[im 1/44]
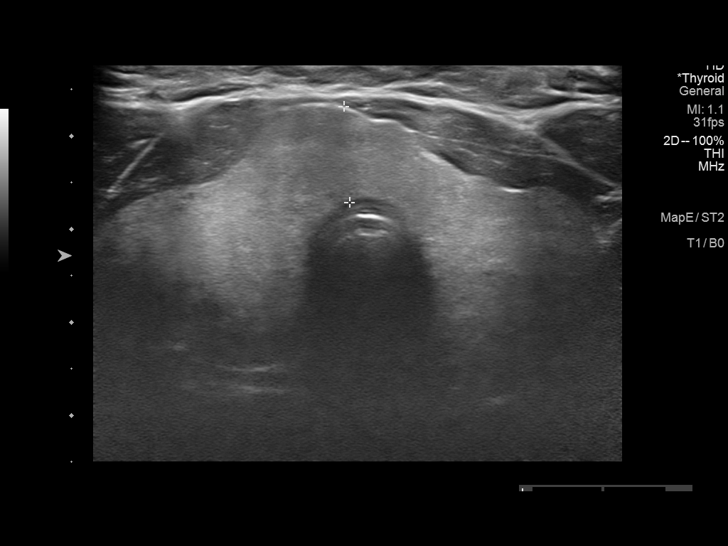
[im 4/44]
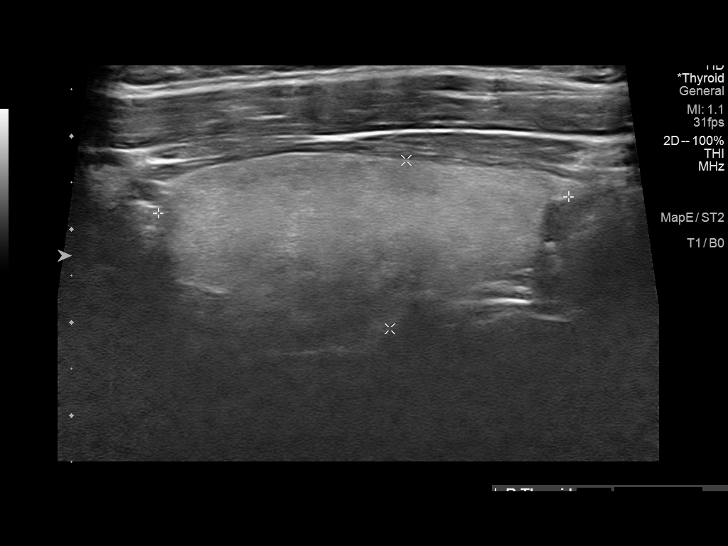
[im 8/44]
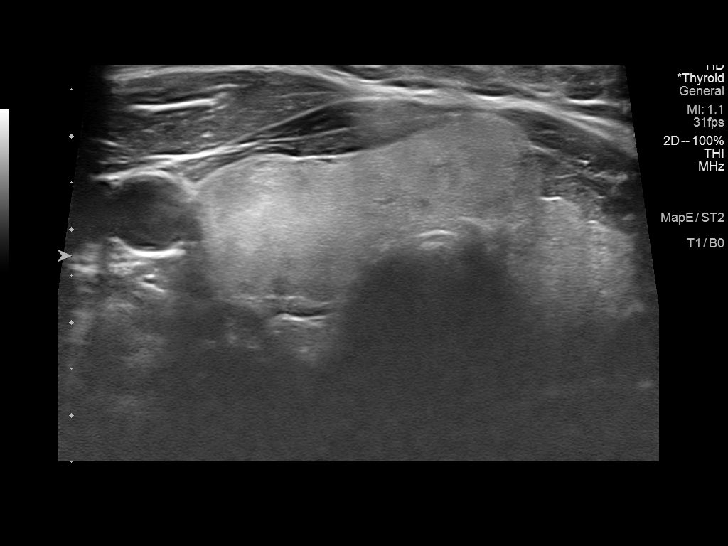
[im 11/44]
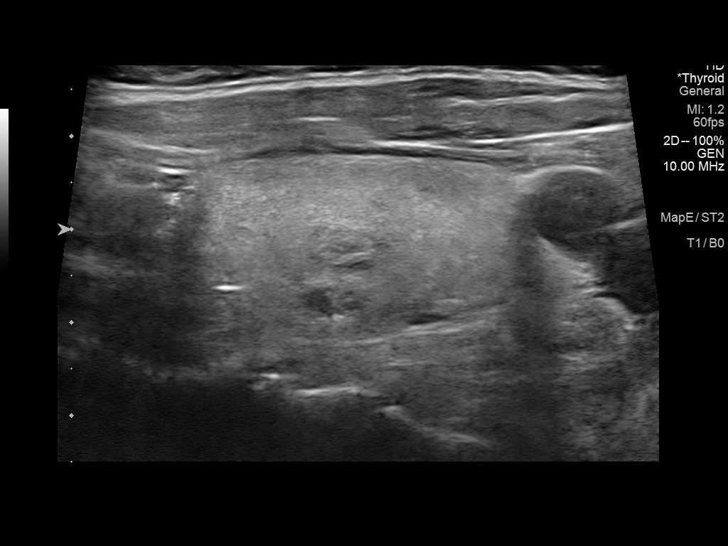
[im 15/44]
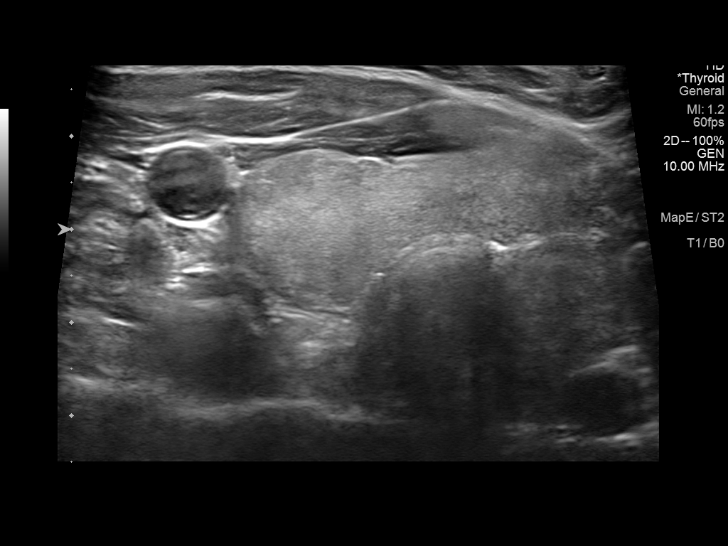
[im 17/44]
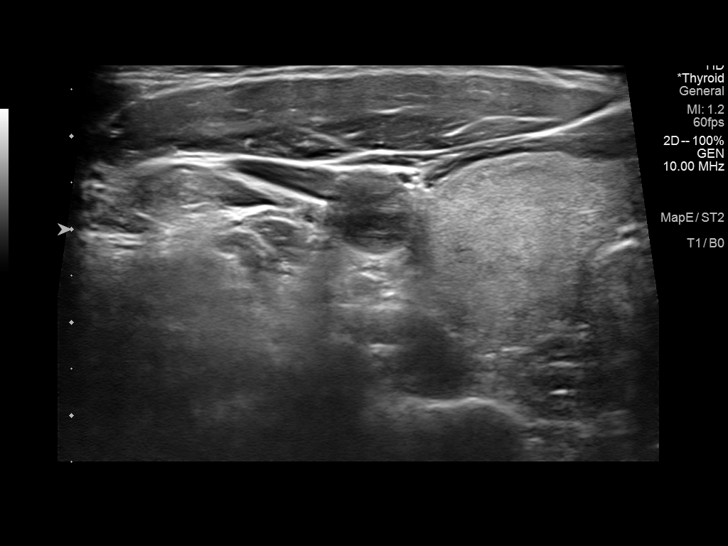
[im 20/44]
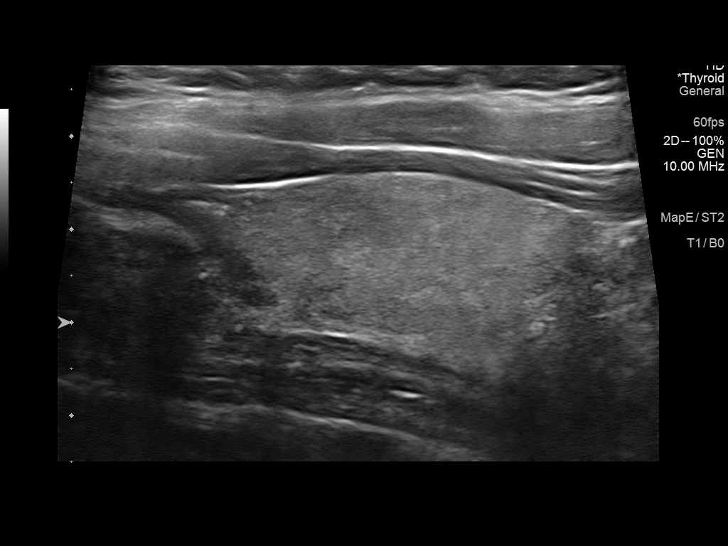
[im 24/44]
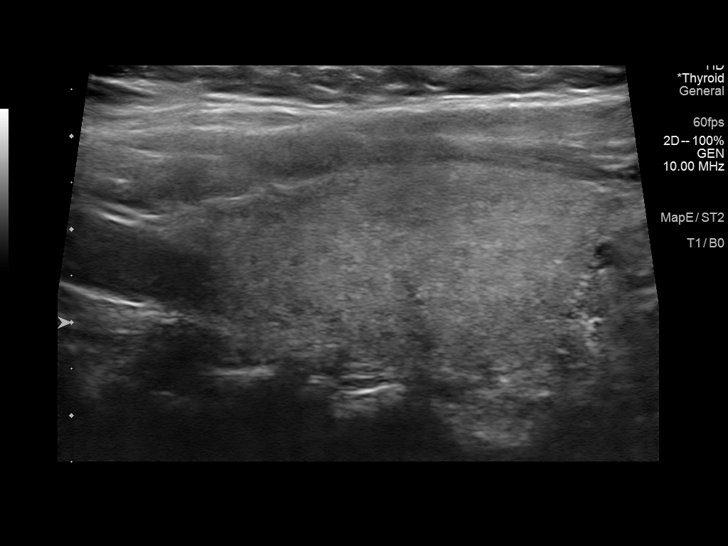
[im 27/44]
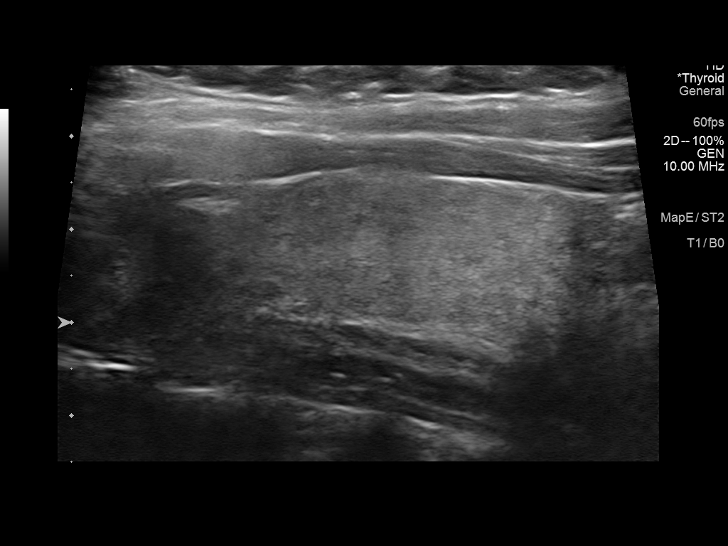
[im 29/44]
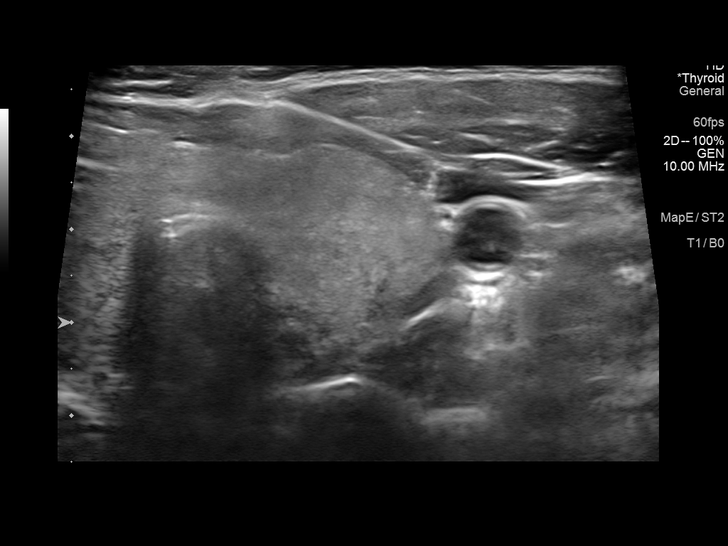
[im 33/44]
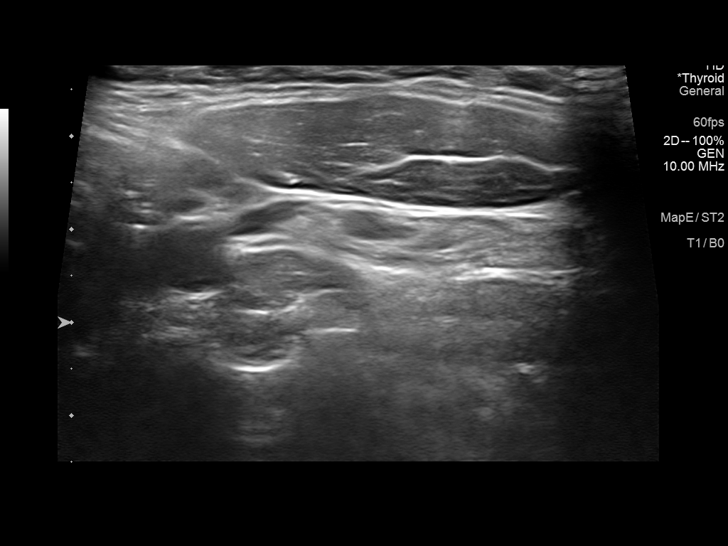
[im 36/44]
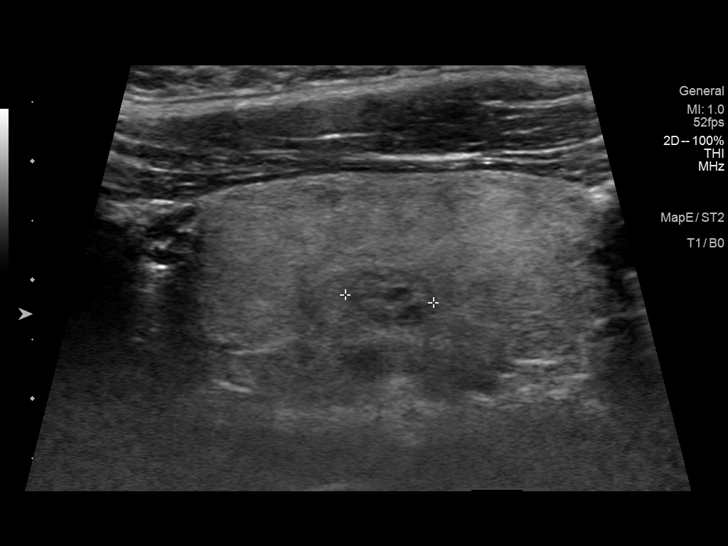
[im 40/44]
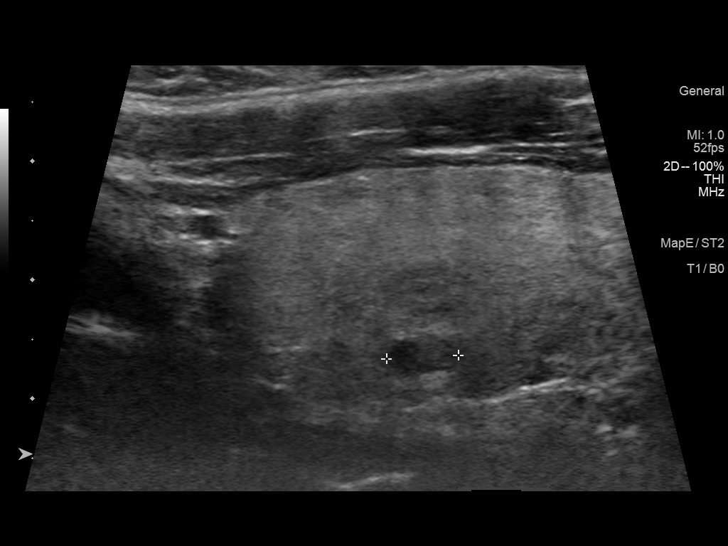
[im 44/44]
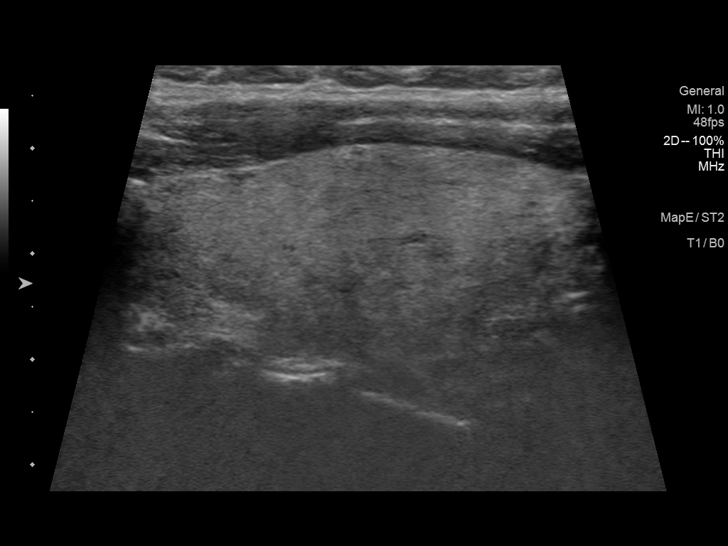

[14 of 25 positions shown; findings below may reference images not displayed]

FINDINGS: Parenchymal Echotexture: Mildly heterogenous

Isthmus: 1.0 cm

Right lobe: 4.4 x 1.8 x 2.0 cm

Left lobe: 4.0 x 1.8 x 1.9 cm

_________________________________________________________

Estimated total number of nodules >/= 1 cm: 0

Number of spongiform nodules >/=  2 cm not described below (TR1): 0

Number of mixed cystic and solid nodules >/= 1.5 cm not described
below (TR2): 0

_________________________________________________________

Mild thyroid heterogeneity. No hypervascularity. Subcentimeter right
thyroid mixed cystic/solid and hypoechoic nodules noted, all 8 mm or
less in size. These would not meet criteria for any biopsy or
follow-up and are not fully described by TI rads criteria. No
regional adenopathy.
IMPRESSION: Minor nonspecific thyroid heterogeneity and subcentimeter right
thyroid nodules noted. No significant finding that warrants
follow-up or biopsy.

The above is in keeping with the ACR TI-RADS recommendations - [HOSPITAL] 8059;[DATE].

## 2021-10-01 NOTE — Telephone Encounter (Signed)
So she got treated with Bicillin but only half a dose and then a course of doxycycline last year.  These labs were a follow-up but are pretty much the same.  Please tell patient her syphilis test is still positive borderline and I would like her to see the infectious disease specialist  Please refer to infectious disease about positive test for syphilis.

## 2021-10-01 NOTE — Progress Notes (Signed)
Mild anemia and low vitamin D level.  Thyroid and blood sugar are normal. The test for syphilis is still borderline positive from last year. Believe you were treated last year although may be need more medicine  I want a referral to infectious disease for consult.  (Please make rreferral to ID dx pos test test for syphilis)   Please take 2000 units of vitamin D per day you can get this over-the-counter.   Please make a follow-up visit with me in person or virtual in January for follow-up if your blood pressure readings are continued high we will adjust medication

## 2021-10-01 NOTE — Addendum Note (Signed)
Addended by: Christy Sartorius on: 10/01/2021 03:21 PM   Modules accepted: Orders

## 2021-10-01 NOTE — Telephone Encounter (Signed)
I spoke with the pt and informed her of the results and she verbalized understanding. Referral to infectious diseases has been placed and pt is aware.

## 2021-10-03 ENCOUNTER — Ambulatory Visit: Payer: Medicaid Other | Admitting: Internal Medicine

## 2021-10-03 NOTE — Progress Notes (Deleted)
[facility] for Infectious Disease  Reason for Consult: Syphilis  Referring Provider: Dr Regis Bill   HPI:    Alyssa Lloyd is a 30 y.o. female with PMHx as below who presents to the clinic for syphilis.   Patient has a history of syphilis diagnosed by positive RPR with titer 1: 2 in September 2021.  She had no previous RPR's for comparison in the 12 months prior.  RPR in 2014 was non-reactive.  There appears to have been a plan for treatment with Bicillin x 3 weekly injections, but she was only treated with Bicillin x 1 dose and she only received 1,200,000 units.  She then was given doxycycline for 14 days in October 2021.  She recently had follow-up with her primary care complaining of fatigue.  A RPR was checked at that time and noted to be 1: 2.  She was subsequently referred to our clinic for further evaluation.  Other labs at that time were notable for negative HIV.  Patient's Medications  New Prescriptions   No medications on file  Previous Medications   ALBUTEROL (VENTOLIN HFA) 108 (90 BASE) MCG/ACT INHALER    INHALE 1 TO 2 PUFFS INTO THE LUNGS EVERY 6 HOURS AS NEEDED FOR WHEEZING OR SHORTNESS OF BREATH   AMLODIPINE (NORVASC) 2.5 MG TABLET    TAKE 1 TABLET BY MOUTH ONCE DAILY FOR ELEVATED BLOOD PRESSURE.   AMOXICILLIN (AMOXIL) 500 MG CAPSULE    Take one capsule by mouth every 8 hours until gone   BENZONATATE (TESSALON) 200 MG CAPSULE    Take 1 capsule (200 mg total) by mouth 2 (two) times daily as needed for cough.   CETIRIZINE (ZYRTEC) 10 MG TABLET    Take 10 mg by mouth daily.   IBUPROFEN (ADVIL,MOTRIN) 800 MG TABLET    Take 1 tablet (800 mg total) by mouth every 8 (eight) hours as needed for moderate pain.  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   HERPES GENITALIS 11/02/2010   Qualifier:  Diagnosis of  By: Regis Bill MD, Standley Brooking    HSV (herpes simplex virus) anogenital infection    PID (acute pelvic inflammatory disease) 01/11/2011    Social History   Tobacco Use   Smoking status: Never   Smokeless tobacco: Never  Vaping Use   Vaping Use: Never used  Substance Use Topics   Alcohol use: No   Drug use: No    Family History  Problem Relation Age of Onset   Hypertension Other    Gallbladder disease Mother    Diabetes Other    Asthma Other    Anemia Other    Allergies Other    Migraines Other     Allergies  Allergen Reactions   Doxycycline Hives    ROS    OBJECTIVE:    There were no vitals filed for this visit.   There is no height or weight on file to calculate BMI.  Physical Exam   Labs and Microbiology:  CBC Latest Ref Rng & Units 09/17/2021 10/13/2020 07/24/2020  WBC 4.0 - 10.5 K/uL 5.3 6.1 6.2  Hemoglobin 12.0 - 15.0 g/dL 11.7(L) 11.8 11.3(L)  Hematocrit 36.0 - 46.0 % 37.2 37.7 36.6  Platelets 150.0 - 400.0 K/uL 426.0(H) 415(H) 377   CMP Latest Ref Rng & Units 09/17/2021 10/13/2020  07/24/2020  Glucose 70 - 99 mg/dL 84 83 16(X)  BUN 6 - 23 mg/dL 14 9 11   Creatinine 0.40 - 1.20 mg/dL 0.96 0.45  Sodium 135 - 145 mEq/L 139 141 139  Potassium 3.5 - 5.1 mEq/L 3.7 4.5 3.8  Chloride 96 - 112 mEq/L 105 106 105  CO2 19 - 32 mEq/L 26 29 29   Calcium 8.4 - 10.5 mg/dL 9.3 9.1 9.8  Total Protein 6.0 - 8.3 g/dL 8.1 - 7.7  Total Bilirubin 0.2 - 1.2 mg/dL 0.4 - 0.4  Alkaline Phos 39 - 117 U/L 72 - -  AST 0 - 37 U/L 16 - 14  ALT 0 - 35 U/L 10 - 9      ASSESSMENT & PLAN:    No problem-specific Assessment & Plan notes found for this encounter.   No orders of the defined types were placed in this encounter.     Based on her labs from Sept 2021, she has late latent syphilis and does not appear to have been adequately treating.  Although she may remain serofast at a low level titer, I recommended that she be appropriately treated with either  Bicillin 2.4 million units IM x 3 weekly doses or with doxycycline 100mg  BID x 28 days.  She opts for ***.  Will have her follow up in 6 months.   for Infectious Disease [facility] 10/03/2021, 12:24 PM

## 2022-01-21 ENCOUNTER — Other Ambulatory Visit (HOSPITAL_COMMUNITY): Payer: Self-pay

## 2022-07-25 ENCOUNTER — Telehealth (INDEPENDENT_AMBULATORY_CARE_PROVIDER_SITE_OTHER): Payer: Self-pay | Admitting: Internal Medicine

## 2022-07-25 ENCOUNTER — Encounter: Payer: Self-pay | Admitting: Internal Medicine

## 2022-07-25 ENCOUNTER — Other Ambulatory Visit (HOSPITAL_COMMUNITY): Payer: Self-pay

## 2022-07-25 VITALS — Ht 62.0 in | Wt 240.0 lb

## 2022-07-25 DIAGNOSIS — Z8709 Personal history of other diseases of the respiratory system: Secondary | ICD-10-CM

## 2022-07-25 DIAGNOSIS — J069 Acute upper respiratory infection, unspecified: Secondary | ICD-10-CM

## 2022-07-25 MED ORDER — HYDROCODONE BIT-HOMATROP MBR 5-1.5 MG/5ML PO SOLN
5.0000 mL | Freq: Four times a day (QID) | ORAL | 0 refills | Status: DC | PRN
  Filled 2022-07-25: qty 120, 6d supply, fill #0

## 2022-07-25 MED ORDER — BENZONATATE 100 MG PO CAPS
100.0000 mg | ORAL_CAPSULE | Freq: Three times a day (TID) | ORAL | 0 refills | Status: DC | PRN
  Filled 2022-07-25: qty 24, 4d supply, fill #0

## 2022-07-25 NOTE — Progress Notes (Signed)
Virtual Visit via Video Note  I connected with Alyssa Lloyd on 07/25/22 at 10:15 AM EDT by a video enabled telemedicine application and verified that I am speaking with the correct person using two identifiers. Location patient: home Location provider:home office Persons participating in the virtual visit: patient, provider  Patient aware  of the limitations of evaluation and management by telemedicine and  availability of in person appointments. and agreed to proceed.   HPI: Alyssa Lloyd presents for video visit presents with about a week of above symptoms.  Upper respiratory congestion and now unrelenting cough when she lays down in certain positions.  No associated fever chest pain shortness of breath of import some headache and throat chest discomfort she feels from coughing.  She is using over-the-counter coughing gels that sound like dextromethorphan.  And a cough and cold medicine She is a Oncologist. She has done many COVID test that are negative.   Video visit 5 2 for sinusitis ROS: See pertinent positives and negatives per HPI. 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 No pertinent surgical history.  Family History  Problem Relation Age of Onset   Hypertension Other    Gallbladder disease Mother    Diabetes Other    Asthma Other    Anemia Other    Allergies Other    Migraines Other     Social History   Tobacco Use   Smoking status: Never   Smokeless tobacco: Never  Vaping Use   Vaping Use: Never used  Substance Use Topics   Alcohol use: No   Drug use: No      Current Outpatient Medications:    amLODipine (NORVASC) 2.5 MG tablet, TAKE 1 TABLET  BY MOUTH ONCE DAILY FOR ELEVATED BLOOD PRESSURE., Disp: 90 tablet, Rfl: 3   benzonatate (TESSALON PERLES) 100 MG capsule, Take 1-2 capsules (100-200 mg total) by mouth 3 (three) times daily as needed for cough., Disp: 24 capsule, Rfl: 0   cetirizine (ZYRTEC) 10 MG tablet, Take 10 mg by mouth daily., Disp: , Rfl:    Cyanocobalamin (VITAMIN B12 PO), Take by mouth., Disp: , Rfl:    HYDROcodone bit-homatropine (HYCODAN) 5-1.5 MG/5ML syrup, Take 5 mLs by mouth every 6 (six) hours as needed for cough. At night, Disp: 120 mL, Rfl: 0   Multiple Vitamins-Minerals (ZINC PO), Take by mouth., Disp: , Rfl:    VITAMIN D PO, Take by mouth., Disp: , Rfl:    albuterol (VENTOLIN HFA) 108 (90 Base) MCG/ACT inhaler, INHALE 1 TO 2 PUFFS INTO THE LUNGS EVERY 6 HOURS AS NEEDED FOR WHEEZING OR SHORTNESS OF BREATH, Disp: 18 g, Rfl: 1   ibuprofen (ADVIL,MOTRIN) 800 MG tablet, Take 1 tablet (800 mg total) by mouth every 8 (eight) hours as needed for moderate pain. (Patient not taking: Reported on 07/25/2022), Disp: 21 tablet, Rfl: 0  EXAM: BP Readings from Last 3 Encounters:  09/17/21 (!) 150/96  10/30/20 (!) 142/88  10/13/20 138/76    VITALS per patient if applicable:  GENERAL: alert, oriented, appears well and in no acute distress loks congested  non toxic   HEENT: atraumatic, conjunttiva clear, no obvious abnormalities on inspection of external nose and ears  NECK: normal movements of the head and neck  LUNGS: on inspection no signs of respiratory distress, breathing rate appears normal, no obvious gross SOB, gasping or wheezing  CV: no obvious cyanosis  MS: moves all visible extremities without noticeable abnormality  PSYCH/NEURO: pleasant and cooperative, no obvious depression or anxiety, speech and thought processing grossly intact  ASSESSMENT AND PLAN:  Discussed the following assessment and plan:    ICD-10-CM   1. Viral upper respiratory tract infection with cough  J06.9     2. History of acute  sinusitis  Z87.09       Counseled.  This is most likely a viral respiratory infection that would run its course and at this point does not seem complicated. Symptomatic treatment nocturnal cough seem to be problematic. Offered [employer] and today and hydrocodone cough medicine at night for supportive care.  Uncertain if insurance will pay for cough medicines.  But she did fail the dextromethorphan. Expectant management expect feeling better next week but persistent cough and reviewed alarm symptoms of when to follow-up if persistent progressive.   Expectant management and discussion of plan and treatment with opportunity to ask questions and all were answered. The patient agreed with the plan and demonstrated an understanding of the instructions.   Advised to call back or seek an in-person evaluation if worsening  or having  further concerns  in interim. Return if symptoms worsen or fail to improve as expected.   Berniece Andreas, MD

## 2022-09-29 ENCOUNTER — Encounter (HOSPITAL_BASED_OUTPATIENT_CLINIC_OR_DEPARTMENT_OTHER): Payer: Self-pay | Admitting: Emergency Medicine

## 2022-09-29 ENCOUNTER — Emergency Department (HOSPITAL_BASED_OUTPATIENT_CLINIC_OR_DEPARTMENT_OTHER)
Admission: EM | Admit: 2022-09-29 | Discharge: 2022-09-30 | Disposition: A | Discharge status: Home / Self Care | Payer: Medicaid Other | Attending: Emergency Medicine | Admitting: Emergency Medicine

## 2022-09-29 ENCOUNTER — Emergency Department (HOSPITAL_BASED_OUTPATIENT_CLINIC_OR_DEPARTMENT_OTHER): Payer: Medicaid Other

## 2022-09-29 ENCOUNTER — Other Ambulatory Visit: Payer: Self-pay

## 2022-09-29 DIAGNOSIS — R202 Paresthesia of skin: Secondary | ICD-10-CM

## 2022-09-29 DIAGNOSIS — Z7951 Long term (current) use of inhaled steroids: Secondary | ICD-10-CM | POA: Insufficient documentation

## 2022-09-29 DIAGNOSIS — R519 Headache, unspecified: Secondary | ICD-10-CM | POA: Insufficient documentation

## 2022-09-29 DIAGNOSIS — J45909 Unspecified asthma, uncomplicated: Secondary | ICD-10-CM | POA: Insufficient documentation

## 2022-09-29 HISTORY — DX: Anemia, unspecified: D64.9

## 2022-09-29 LAB — BASIC METABOLIC PANEL
Anion gap: 7 (ref 5–15)
BUN: 15 mg/dL (ref 6–20)
CO2: 24 mmol/L (ref 22–32)
Calcium: 8.6 mg/dL — ABNORMAL LOW (ref 8.9–10.3)
Chloride: 107 mmol/L (ref 98–111)
Creatinine, Ser: 0.84 mg/dL (ref 0.44–1.00)
GFR, Estimated: >60 mL/min (ref >60)
Glucose, Bld: 92 mg/dL (ref 70–99)
Potassium: 3.4 mmol/L — ABNORMAL LOW (ref 3.5–5.1)
Sodium: 138 mmol/L (ref 135–145)

## 2022-09-29 LAB — CBC WITH DIFFERENTIAL/PLATELET
Abs Immature Granulocytes: 0.01 10*3/uL (ref 0.00–0.07)
Basophils Absolute: 0 10*3/uL (ref 0.0–0.1)
Basophils Relative: 1 %
Eosinophils Absolute: 0.7 10*3/uL — ABNORMAL HIGH (ref 0.0–0.5)
Eosinophils Relative: 11 %
HCT: 34.8 % — ABNORMAL LOW (ref 36.0–46.0)
Hemoglobin: 10.7 g/dL — ABNORMAL LOW (ref 12.0–15.0)
Immature Granulocytes: 0 %
Lymphocytes Relative: 40 %
Lymphs Abs: 2.5 10*3/uL (ref 0.7–4.0)
MCH: 23.6 pg — ABNORMAL LOW (ref 26.0–34.0)
MCHC: 30.7 g/dL (ref 30.0–36.0)
MCV: 76.7 fL — ABNORMAL LOW (ref 80.0–100.0)
Monocytes Absolute: 0.3 10*3/uL (ref 0.1–1.0)
Monocytes Relative: 5 %
Neutro Abs: 2.7 10*3/uL (ref 1.7–7.7)
Neutrophils Relative %: 43 %
Platelets: 402 10*3/uL — ABNORMAL HIGH (ref 150–400)
RBC: 4.54 MIL/uL (ref 3.87–5.11)
RDW: 15.2 % (ref 11.5–15.5)
WBC: 6.2 10*3/uL (ref 4.0–10.5)
nRBC: 0 % (ref 0.0–0.2)

## 2022-09-29 MED ORDER — METOCLOPRAMIDE HCL 5 MG/ML IJ SOLN
10.0000 mg | Freq: Once | INTRAMUSCULAR | Status: DC
  Filled 2022-09-29: qty 2

## 2022-09-29 MED ORDER — KETOROLAC TROMETHAMINE 15 MG/ML IJ SOLN
15.0000 mg | Freq: Once | INTRAMUSCULAR | Status: AC | PRN
  Administered 2022-09-30: 15 mg via INTRAVENOUS
  Filled 2022-09-29: qty 1

## 2022-09-29 NOTE — ED Triage Notes (Signed)
Pt reports shooting pains all over body, including head, chest and RLE for a couple days; denies other sxs

## 2022-09-29 NOTE — ED Provider Notes (Signed)
[facility] Provider Note: Lowella Dell, MD, FACEP  CSN: [id number] MRN: [id number] ARRIVAL: 09/29/22 at 2158 ROOM: [facility]   CHIEF COMPLAINT  Shooting Pains   HISTORY OF PRESENT ILLNESS  09/29/22 10:54 PM Alyssa Lloyd is a 31 y.o. female who developed a right-sided headache earlier today.  The headache is manifested as intermittent sharp, shooting pains around the right side of the face and head.  The pain does not ever go away but waxes and wanes.  It is not worse with palpation but there are some paresthesias associated that have caused the sensation on the right side of her head and face to be altered.  In association with this she has had pain and paresthesias in a stocking pattern of her right lower leg.  She rates her pain as an 8 out of 10.  She also has a history of asthma and has felt chest tightness for the past 2 days.   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 No pertinent surgical history.  Family History  Problem Relation Age of Onset   Hypertension Other    Gallbladder disease Mother    Diabetes Other    Asthma Other    Anemia Other    Allergies Other    Migraines Other     Social History   Tobacco Use   Smoking status: Never   Smokeless tobacco: Never  Vaping Use   Vaping Use: Never used  Substance Use Topics   Alcohol use: No   Drug use: No    Prior to Admission medications   Medication Sig Start Date End Date Taking? Authorizing Provider  albuterol (VENTOLIN HFA) 108 (90 Base) MCG/ACT inhaler INHALE 1 TO 2 PUFFS INTO THE LUNGS EVERY 6 HOURS AS NEEDED FOR WHEEZING OR SHORTNESS OF BREATH 03/05/21 03/05/22  Panosh, Neta Mends, MD  amLODipine (NORVASC) 2.5 MG tablet TAKE 1 TABLET BY MOUTH ONCE DAILY FOR ELEVATED BLOOD PRESSURE.  09/17/21 09/17/22  Panosh, Neta Mends, MD  benzonatate (TESSALON PERLES) 100 MG capsule Take 1-2 capsules (100-200 mg total) by mouth 3 (three) times daily as needed for cough. 07/25/22   Panosh, Neta Mends, MD  cetirizine (ZYRTEC) 10 MG tablet Take 10 mg by mouth daily.    [provider]  Cyanocobalamin (VITAMIN B12 PO) Take by mouth.    [provider]  HYDROcodone bit-homatropine (HYCODAN) 5-1.5 MG/5ML syrup Take 5 mLs by mouth every 6 (six) hours as needed for cough at night 07/25/22   Panosh, Neta Mends, MD  Multiple Vitamins-Minerals (ZINC PO) Take by mouth.    [provider]  VITAMIN D PO Take by mouth.    [provider]    Allergies Doxycycline   REVIEW OF SYSTEMS  Negative except as noted here or in the History of Present Illness.   PHYSICAL EXAMINATION  Initial Vital Signs Blood pressure (!) 139/97, pulse (!) 56, temperature 98.3 F (36.8 C), resp. rate 16, height 5' 2.5" (1.588 m), last menstrual period 09/26/2022, SpO2 100 %.  Examination General: Well-developed, well-nourished female in no acute distress; appearance consistent with age of record HENT: normocephalic; atraumatic Eyes: pupils equal, round and reactive to light; extraocular muscles intact Neck: supple Heart: regular rate and rhythm Lungs: clear to auscultation bilaterally Abdomen: soft; nondistended; nontender; bowel sounds  present Extremities: No deformity; full range of motion; pulses normal Neurologic: Awake, alert and oriented; motor function intact in all extremities and symmetric; no facial droop; altered sensation of right side of face and right lower leg Skin: Warm and dry Psychiatric: Normal mood and affect   RESULTS  Summary of this visit's results, reviewed and interpreted by myself:   EKG Interpretation  Date/Time:  Sunday September 29 2022 22:09:43 EST Ventricular Rate:  62 PR Interval:  156 QRS Duration: 74 QT Interval:  432 QTC Calculation: 438 R  Axis:   11 Text Interpretation: Normal sinus rhythm with sinus arrhythmia Minimal voltage criteria for LVH, may be normal variant ( R in aVL ) Borderline ECG No previous ECGs available Confirmed by Paula Libra ([id number]) on 09/29/2022 10:48:29 PM       Laboratory Studies: Results for orders placed or performed during the hospital encounter of 09/29/22 (from the past 24 hour(s))  CBC with Differential     Status: Abnormal   Collection Time: 09/29/22 11:28 PM  Result Value Ref Range   WBC 6.2 4.0 - 10.5 K/uL   RBC 4.54 3.87 - 5.11 MIL/uL   Hemoglobin 10.7 (L) 12.0 - 15.0 g/dL   HCT 84.6 (L) 96.2 - 95.2 %   MCV 76.7 (L) 80.0 - 100.0 fL   MCH 23.6 (L) 26.0 - 34.0 pg   MCHC 30.7 30.0 - 36.0 g/dL   RDW 84.1 32.4 - 40.1 %   Platelets 402 (H) 150 - 400 K/uL   nRBC 0.0 0.0 - 0.2 %   Neutrophils Relative % 43 %   Neutro Abs 2.7 1.7 - 7.7 K/uL   Lymphocytes Relative 40 %   Lymphs Abs 2.5 0.7 - 4.0 K/uL   Monocytes Relative 5 %   Monocytes Absolute 0.3 0.1 - 1.0 K/uL   Eosinophils Relative 11 %   Eosinophils Absolute 0.7 (H) 0.0 - 0.5 K/uL   Basophils Relative 1 %   Basophils Absolute 0.0 0.0 - 0.1 K/uL   Immature Granulocytes 0 %   Abs Immature Granulocytes 0.01 0.00 - 0.07 K/uL  Troponin I (High Sensitivity)     Status: None   Collection Time: 09/29/22 11:28 PM  Result Value Ref Range   Troponin I (High Sensitivity) 3 <18 ng/L  Basic metabolic panel     Status: Abnormal   Collection Time: 09/29/22 11:28 PM  Result Value Ref Range   Sodium 138 135 - 145 mmol/L   Potassium 3.4 (L) 3.5 - 5.1 mmol/L   Chloride 107 98 - 111 mmol/L   CO2 24 22 - 32 mmol/L   Glucose, Bld 92 70 - 99 mg/dL   BUN 15 6 - 20 mg/dL   Creatinine, Ser 0.27 0.44 - 1.00 mg/dL   Calcium 8.6 (L) 8.9 - 10.3 mg/dL   GFR, Estimated >25 >36 mL/min   Anion gap 7 5 - 15  hCG, serum, qualitative     Status: None   Collection Time: 09/29/22 11:28 PM  Result Value Ref Range   Preg, Serum NEGATIVE NEGATIVE   Imaging  Studies: CT Head Wo Contrast  Result Date: 09/29/2022 CLINICAL DATA:  Neuro deficit, acute, stroke suspected. Shooting pains all over body. EXAM: CT HEAD WITHOUT CONTRAST TECHNIQUE: Contiguous axial images were obtained from the base of the skull through the vertex without intravenous contrast. RADIATION DOSE REDUCTION: This exam was performed according to the departmental dose-optimization program which includes automated exposure control, adjustment of the mA and/or kV according to patient  size and/or use of iterative reconstruction technique. COMPARISON:  None Available. FINDINGS: Brain: No acute intracranial hemorrhage, midline shift or mass effect. No extra-axial fluid collection. Gray-white matter differentiation is within normal limits. No hydrocephalus. Vascular: No hyperdense vessel or unexpected calcification. Skull: Normal. Negative for fracture or focal lesion. Sinuses/Orbits: No acute finding. Other: None. IMPRESSION: No acute intracranial process. Electronically Signed   By: Thornell Sartorius M.D.   On: 09/29/2022 23:23    ED COURSE and MDM  Nursing notes, initial and subsequent vitals signs, including pulse oximetry, reviewed and interpreted by myself.  Vitals:   09/29/22 2205 09/29/22 2205 09/29/22 2321 09/30/22 0000  BP:  (!) 139/97 (!) 132/98 (!) 126/96  Pulse:  (!) 56 (!) 58 (!) 52  Resp:  16    Temp:  98.3 F (36.8 C)    SpO2:  100% 99% 98%  Height: 5' 2.5" (1.588 m)      Medications  ketorolac (TORADOL) 15 MG/ML injection 15 mg (15 mg Intravenous Given 09/30/22 0013)   12:28 AM The patient's head CT is unremarkable.  The pattern of her paresthesias is unusual (face and lower leg).  This could represent a complex migraine (she refused Reglan but did get improvement with Toradol) but could also represent a more complex process such as multiple sclerosis.  She was advised to follow-up with her PCP, Dr. Fabian Sharp, if symptoms persist or worsen.  She may need an outpatient MRI to work  this up.  Her EKG is nonischemic and her troponin is normal.  Her lungs are clear so the cause of her chest tightness is not clear.  Her anemia appears fairly stable.  She states she has had this for "a long time".   PROCEDURES  Procedures   ED DIAGNOSES     ICD-10-CM   1. Paresthesias  R20.2          Paula Libra, MD 09/30/22 [id number]

## 2022-09-30 ENCOUNTER — Encounter (HOSPITAL_BASED_OUTPATIENT_CLINIC_OR_DEPARTMENT_OTHER): Payer: Self-pay | Admitting: Emergency Medicine

## 2022-09-30 LAB — HCG, SERUM, QUALITATIVE: Preg, Serum: NEGATIVE

## 2022-09-30 LAB — TROPONIN I (HIGH SENSITIVITY): Troponin I (High Sensitivity): 3 ng/L (ref <18)

## 2022-09-30 NOTE — ED Notes (Signed)
Pt reports she feel much better, headache is a 2/10 and the shooting pains are gone. Pt is ready to go home.

## 2023-01-10 ENCOUNTER — Other Ambulatory Visit: Payer: Self-pay | Admitting: Internal Medicine

## 2023-02-28 ENCOUNTER — Encounter (HOSPITAL_COMMUNITY): Payer: Self-pay

## 2023-02-28 ENCOUNTER — Emergency Department (HOSPITAL_COMMUNITY)
Admission: EM | Admit: 2023-02-28 | Discharge: 2023-02-28 | Disposition: A | Discharge status: Home / Self Care | Payer: Commercial Managed Care - PPO | Attending: Emergency Medicine | Admitting: Emergency Medicine

## 2023-02-28 ENCOUNTER — Other Ambulatory Visit (HOSPITAL_COMMUNITY): Payer: Self-pay

## 2023-02-28 ENCOUNTER — Other Ambulatory Visit (HOSPITAL_BASED_OUTPATIENT_CLINIC_OR_DEPARTMENT_OTHER): Payer: Self-pay

## 2023-02-28 ENCOUNTER — Emergency Department (HOSPITAL_BASED_OUTPATIENT_CLINIC_OR_DEPARTMENT_OTHER): Payer: Commercial Managed Care - PPO | Admitting: Radiology

## 2023-02-28 ENCOUNTER — Emergency Department (HOSPITAL_BASED_OUTPATIENT_CLINIC_OR_DEPARTMENT_OTHER)
Admission: EM | Admit: 2023-02-28 | Discharge: 2023-02-28 | Disposition: A | Discharge status: Home / Self Care | Payer: Commercial Managed Care - PPO | Attending: Emergency Medicine | Admitting: Emergency Medicine

## 2023-02-28 ENCOUNTER — Telehealth: Payer: Self-pay | Admitting: Internal Medicine

## 2023-02-28 ENCOUNTER — Encounter (HOSPITAL_BASED_OUTPATIENT_CLINIC_OR_DEPARTMENT_OTHER): Payer: Self-pay

## 2023-02-28 ENCOUNTER — Ambulatory Visit: Payer: Medicaid Other | Admitting: Family Medicine

## 2023-02-28 ENCOUNTER — Other Ambulatory Visit: Payer: Self-pay

## 2023-02-28 DIAGNOSIS — Z91010 Allergy to peanuts: Secondary | ICD-10-CM | POA: Diagnosis not present

## 2023-02-28 DIAGNOSIS — S8991XA Unspecified injury of right lower leg, initial encounter: Secondary | ICD-10-CM | POA: Diagnosis present

## 2023-02-28 DIAGNOSIS — W57XXXA Bitten or stung by nonvenomous insect and other nonvenomous arthropods, initial encounter: Secondary | ICD-10-CM | POA: Insufficient documentation

## 2023-02-28 DIAGNOSIS — M25561 Pain in right knee: Secondary | ICD-10-CM

## 2023-02-28 DIAGNOSIS — S8001XA Contusion of right knee, initial encounter: Secondary | ICD-10-CM | POA: Diagnosis not present

## 2023-02-28 DIAGNOSIS — M79604 Pain in right leg: Secondary | ICD-10-CM

## 2023-02-28 DIAGNOSIS — M79661 Pain in right lower leg: Secondary | ICD-10-CM | POA: Insufficient documentation

## 2023-02-28 MED ORDER — CEPHALEXIN 500 MG PO CAPS
500.0000 mg | ORAL_CAPSULE | Freq: Two times a day (BID) | ORAL | 0 refills | Status: AC
  Filled 2023-02-28: qty 14, 7d supply, fill #0

## 2023-02-28 NOTE — ED Provider Notes (Signed)
[place] EMERGENCY DEPARTMENT AT [facility] Provider Note   CSN: [id number] Arrival date & time: 02/28/23  1101     History  Chief Complaint  Patient presents with   Insect Bite    Alyssa Lloyd is a 32 y.o. female with medical history of HSV, anemia, PID.  Patient presents to ED for evaluation of right knee pain and swelling, bruising.  Patient reports that on Wednesday night/Thursday morning she was at a rehearsal for a dance presentation.  Patient states that at 1 point while she was standing still she felt a sudden "pinch" on her lateral right knee.  Patient was concerned about a spider bite, went to a restroom and undressed fully and did not find any spider.  Patient states that she has developed progressively worsening bruising to her right lateral knee since this time.  Patient is concerned for infection, spider bite.  Patient was actually seen and discharged from outside facility 2 hours ago where previous provider thought that spot on patient leg was more consistent with bruising as opposed to spider bite.  The patient was placed on Keflex as a precaution.  Patient arrives here with continued concern for infection.  Patient denies fevers, night sweats or chills, body aches or chills, nausea, vomiting, swelling of right knee.  HPI     Home Medications Prior to Admission medications   Medication Sig Start Date End Date Taking? Authorizing Provider  albuterol (VENTOLIN HFA) 108 (90 Base) MCG/ACT inhaler INHALE 1 TO 2 PUFFS INTO THE LUNGS EVERY 6 HOURS AS NEEDED FOR WHEEZING OR SHORTNESS OF BREATH 03/05/21 03/05/22  Panosh, Neta Mends, MD  amLODipine (NORVASC) 2.5 MG tablet TAKE ONE TABLET BY MOUTH ONE TIME DAILY FOR ELEVATED BLOOD PRESSURE 01/10/23   Worthy Rancher B, FNP  benzonatate (TESSALON PERLES) 100 MG capsule Take 1-2 capsules (100-200 mg total) by mouth 3 (three) times daily as needed for cough. 07/25/22   Panosh, Neta Mends, MD  cephALEXin (KEFLEX) 500 MG capsule Take  1 capsule (500 mg total) by mouth 2 (two) times daily for 7 days. 02/28/23 03/07/23  Rondel Baton, MD  cetirizine (ZYRTEC) 10 MG tablet Take 10 mg by mouth daily.    [provider]  Cyanocobalamin (VITAMIN B12 PO) Take by mouth.    [provider]  HYDROcodone bit-homatropine (HYCODAN) 5-1.5 MG/5ML syrup Take 5 mLs by mouth every 6 (six) hours as needed for cough at night 07/25/22   Panosh, Neta Mends, MD  Multiple Vitamins-Minerals (ZINC PO) Take by mouth.    [provider]  VITAMIN D PO Take by mouth.    [provider]      Allergies    Coconut (cocos nucifera), Doxycycline, and Peanut (diagnostic)    Review of Systems   Review of Systems  Musculoskeletal:  Negative for arthralgias and joint swelling.  Skin:  Positive for color change.  All other systems reviewed and are negative.   Physical Exam Updated Vital Signs BP (!) 144/94 (BP Location: Left Arm)   Pulse 67   Temp 98.3 F (36.8 C) (Oral)   Resp 18   Ht 5\' 2"  (1.575 m)   Wt 108.9 kg   SpO2 100%   BMI 43.90 kg/m  Physical Exam Vitals and nursing note reviewed.  Constitutional:      General: She is not in acute distress.    Appearance: Normal appearance. She is not ill-appearing, toxic-appearing or diaphoretic.  HENT:     Head: Normocephalic and atraumatic.  Nose: Nose normal.     Mouth/Throat:     Mouth: Mucous membranes are moist.     Pharynx: Oropharynx is clear.  Eyes:     Extraocular Movements: Extraocular movements intact.     Conjunctiva/sclera: Conjunctivae normal.     Pupils: Pupils are equal, round, and reactive to light.  Cardiovascular:     Rate and Rhythm: Normal rate and regular rhythm.  Pulmonary:     Effort: Pulmonary effort is normal.     Breath sounds: Normal breath sounds. No wheezing.  Abdominal:     General: Abdomen is flat. Bowel sounds are normal.     Palpations: Abdomen is soft.     Tenderness: There is no abdominal tenderness.   Musculoskeletal:     Cervical back: Normal range of motion and neck supple. No tenderness.     Right knee: Ecchymosis present. No swelling or deformity.     Left knee: Normal.     Comments: 6 cm diameter bruising to right lateral knee without erythema or warmth.  Full range of motion of right knee appreciated.  No peripheral edema.  2+ DP pulse in the right foot, brisk cap refill, neurovascularly intact  Skin:    General: Skin is warm and dry.     Capillary Refill: Capillary refill takes less than 2 seconds.  Neurological:     Mental Status: She is alert and oriented to person, place, and time.       ED Results / Procedures / Treatments   Labs (all labs ordered are listed, but only abnormal results are displayed) Labs Reviewed - No data to display  EKG None  Radiology DG Knee 2 Views Right  Result Date: 02/28/2023 CLINICAL DATA:  Pain and bruising. EXAM: RIGHT KNEE - 13 VIEW COMPARISON:  None Available. FINDINGS: No fracture or dislocation. Preserved joint spaces and bone mineralization. No joint effusion on lateral view. There is a marker seen along the lateral aspect of the knee soft tissues at the level of the femoral condyle. No soft tissue gas. IMPRESSION: No acute osseous abnormality.  No joint effusion Electronically Signed   By: Karen Kays M.D.   On: 02/28/2023 12:18    Procedures Procedures   Medications Ordered in ED Medications - No data to display  ED Course/ Medical Decision Making/ A&P  Medical Decision Making Amount and/or Complexity of Data Reviewed Radiology: ordered.   32 year old female presents to the ED for evaluation.  Please see HPI for details.  On examination the patient is afebrile and nontachycardic.  Lung sounds are clear bilaterally, she is not hypoxic.  Her abdomen is soft and compressible throughout.  Neuroexam at baseline.  Right lateral knee with 6 cm diameter bruise without erythema, warmth.  Full range of motion of right knee  appreciated so low suspicion for effusion.  No evidence of bite marks or infection to indicate further management at this time.  Patient was seen at outside hospital and discharged 2 hours prior to arrival here.  Previous provider believes hematoma was from an injury the patient does not realize she sustained during the course of her condition.  Patient can bear weight on right lower extremity without difficulty, no unstable gait.  Patient very concerned about infection.  Patient was given Keflex prescription at previous provider visit which she has not taken dose of yet.  Patient was offered x-ray to rule out joint effusion which she agreed to.  The x-ray of the patient right knee is unremarkable.  The  patient will be discharged at this time and advised to follow-up with her PCP.  Patient advised to elevate and ice her leg, take Keflex to ensure no secondary bacterial infection.  Patient provided return precautions and she voiced understanding.  Patient had all of her questions answered to her satisfaction.  Patient stable for discharge home at this time.   Final Clinical Impression(s) / ED Diagnoses Final diagnoses:  Acute pain of right knee    Rx / DC Orders ED Discharge Orders     None         Al Decant, PA-C 02/28/23 1234    Derwood Kaplan, MD 03/01/23 1640

## 2023-02-28 NOTE — Telephone Encounter (Signed)
Vera, Triage Nurse, called with an ER outcome for pt. She stated pt is currently on her way home from the ER, she was bitten by a [employer] on her right leg in the knee area. The bite is red and purple and pt states her leg feels old, numb and tingling. Per pt, ER sent her home after being seen, she was seen at the [facility] ER. Triage nurse recommended pt be seen ASAP, scheduled pt for appt today, pt aware.

## 2023-02-28 NOTE — Discharge Instructions (Addendum)
You were seen for your leg bruising in the emergency department.  It is unclear if this is from a spider bite at this time.  At home, please continue the hydrocortisone ointment and use ice.  You may also use the antibiotics we have prescribed you in case there is an underlying infection.  Please note it may grow a small amount in the next 1 to 2 days and this is to be expected.  Check your MyChart online for the results of any tests that had not resulted by the time you left the emergency department.   Follow-up with your primary doctor in 2-3 days regarding your visit.    Return immediately to the emergency department if you experience any of the following: Fevers, redness that spreads up your legs, or any other concerning symptoms.    Thank you for visiting our Emergency Department. It was a pleasure taking care of you today.

## 2023-02-28 NOTE — Discharge Instructions (Signed)
Return to the ED with any new or worsening signs or symptoms such as fevers, reduced range of motion of right knee Please follow-up with your PCP for further management Please take Keflex that you were provided by previous provider.  Please take this to completion. You may RICE your knee at home.  This stands for rest, ice, compression and elevation

## 2023-02-28 NOTE — ED Triage Notes (Signed)
"  Wednesday she was putting on pants from dryer and felt sting to left lower leg. Now there is discoloration and abnormal sensations to the area. Went to urgent care and they thought it was a contusion, but it is getting worse" per EMS

## 2023-02-28 NOTE — Telephone Encounter (Signed)
Update, triage recommended pt return to the ED. Called pt and informed her, she verbalized understanding and stated she would go back to the ED.

## 2023-02-28 NOTE — ED Notes (Addendum)
Pt not in room. Pt has discharge orders. Pt not given discharge paperwork by this RN. Pt left before this RN arrived to the room.

## 2023-02-28 NOTE — ED Provider Notes (Signed)
[place] EMERGENCY DEPARTMENT AT [facility] Provider Note   CSN: [id number] Arrival date & time: 02/28/23  4098     History  Chief Complaint  Patient presents with   Leg Pain    Alyssa Lloyd is a 32 y.o. female.  32 year old female who presents to the emergency department with right leg pain.  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  Says that she was concerned about a spider bite and went to the bathroom and fully undressed and did not see any spiders.  Says that since then she has had mild worsening of a bruise on her right lateral knee.  Denies any fevers.  No difficulty moving her knee.  No injuries.  No bleeding.       Home Medications Prior to Admission medications   Medication Sig Start Date End Date Taking? Authorizing Provider  cephALEXin (KEFLEX) 500 MG capsule Take 1 capsule (500 mg total) by mouth 2 (two) times daily for 7 days. 02/28/23 03/07/23 Yes Rondel Baton, MD  albuterol (VENTOLIN HFA) 108 (90 Base) MCG/ACT inhaler INHALE 1 TO 2 PUFFS INTO THE LUNGS EVERY 6 HOURS AS NEEDED FOR WHEEZING OR SHORTNESS OF BREATH 03/05/21 03/05/22  Panosh, Neta Mends, MD  amLODipine (NORVASC) 2.5 MG tablet TAKE ONE TABLET BY MOUTH ONE TIME DAILY FOR ELEVATED BLOOD PRESSURE 01/10/23   Worthy Rancher B, FNP  benzonatate (TESSALON PERLES) 100 MG capsule Take 1-2 capsules (100-200 mg total) by mouth 3 (three) times daily as needed for cough. 07/25/22   Panosh, Neta Mends, MD  cetirizine (ZYRTEC) 10 MG tablet Take 10 mg by mouth daily.    [provider]  Cyanocobalamin (VITAMIN B12 PO) Take by mouth.    [provider]  HYDROcodone bit-homatropine (HYCODAN) 5-1.5 MG/5ML syrup Take 5 mLs by mouth every 6 (six) hours as needed for cough at night 07/25/22   Panosh, Neta Mends, MD  Multiple Vitamins-Minerals (ZINC PO) Take by mouth.    [provider]  VITAMIN D PO Take by mouth.     [provider]      Allergies    Doxycycline    Review of Systems   Review of Systems  Physical Exam Updated Vital Signs BP (!) 141/95 (BP Location: Right Arm)   Pulse 74   Temp 98.1 F (36.7 C) (Oral)   Resp 17   Ht 5' 2.5" (1.588 m)   Wt 108.9 kg   SpO2 100%   BMI 43.20 kg/m  Physical Exam Vitals and nursing note reviewed.  Constitutional:      General: She is not in acute distress.    Appearance: She is well-developed.  HENT:     Head: Normocephalic and atraumatic.     Right Ear: External ear normal.     Left Ear: External ear normal.     Nose: Nose normal.  Eyes:     Extraocular Movements: Extraocular movements intact.     Conjunctiva/sclera: Conjunctivae normal.     Pupils: Pupils are equal, round, and reactive to light.  Pulmonary:     Effort: Pulmonary effort is normal.  Musculoskeletal:     Cervical back: Normal range of motion and neck supple.     Right lower leg: No edema.     Left lower leg: No edema.     Comments: 6 cm diameter bruise of right lateral knee.  No warmth  or erythema.  No joint swelling.  Full range of motion of right knee.  No significant edema distally.  Skin:    General: Skin is warm and dry.  Neurological:     Mental Status: She is alert and oriented to person, place, and time. Mental status is at baseline.  Psychiatric:        Mood and Affect: Mood normal.     ED Results / Procedures / Treatments   Labs (all labs ordered are listed, but only abnormal results are displayed) Labs Reviewed - No data to display  EKG None  Radiology No results found.  Procedures Procedures   Medications Ordered in ED Medications - No data to display  ED Course/ Medical Decision Making/ A&P                             Medical Decision Making Risk Prescription drug management.   Alyssa Lloyd is a 32 y.o. female who presents to the emergency department with bruising of the right lateral knee with concerns for  spider bite  Initial Ddx:  Insect bite, ligamentous injury, hematoma  MDM:  Feel the patient likely has a hematoma from an injury that she may have sustained but did not realize.  Brown recluse bites occasionally can have similar appearances but are typically not painful when they occur.  She is able to walk and does not have significant joint effusion right now also not feel that x-rays are warranted currently.  Patient is fairly convinced that it was a spider that bit her and is very concerned about infection.  No symptoms that would be concerning for coagulopathy at this time and she is overall very well-appearing.  Feel this is less likely but will give her a Keflex prescription and instruct her to follow-up with her primary doctor in several days regarding her symptoms.  Did counsel her that it would be normal for this sort of bruising to progress and become slightly larger but did go over return precautions with her.  Plan:  Keflex PCP follow-up  This patient presents to the ED for concern of complaints listed in HPI, this involves an extensive number of treatment options, and is a complaint that carries with it a high risk of complications and morbidity. Disposition including potential need for admission considered.   Dispo: DC Home. Return precautions discussed including, but not limited to, those listed in the AVS. Allowed pt time to ask questions which were answered fully prior to dc.  Records reviewed Outpatient Clinic Notes I have reviewed the patients home medications and made adjustments as needed  Final Clinical Impression(s) / ED Diagnoses Final diagnoses:  Pain of right lower extremity    Rx / DC Orders ED Discharge Orders          Ordered    cephALEXin (KEFLEX) 500 MG capsule  2 times daily        02/28/23 0920              Rondel Baton, MD 02/28/23 [phone]

## 2023-02-28 NOTE — ED Triage Notes (Signed)
Pt reports spider bite to the left knee on Thursday morning. Pt has tingling, pain, and swelling in left leg.

## 2023-03-05 ENCOUNTER — Ambulatory Visit: Payer: Commercial Managed Care - PPO | Admitting: Family

## 2023-03-05 ENCOUNTER — Encounter: Payer: Self-pay | Admitting: Family

## 2023-03-05 VITALS — BP 118/82 | HR 70 | Temp 98.4°F | Ht 62.0 in | Wt 242.4 lb

## 2023-03-05 DIAGNOSIS — M25561 Pain in right knee: Secondary | ICD-10-CM | POA: Diagnosis not present

## 2023-03-06 ENCOUNTER — Encounter: Payer: Self-pay | Admitting: Family

## 2023-03-06 NOTE — Progress Notes (Signed)
Acute Office Visit  Subjective:     Patient ID: Alyssa Lloyd, female    DOB: [birth year], 32 y.o.   MRN: [id number]  Chief Complaint  Patient presents with  . Insect Bite    Patient states she has a spider bite on the right knee x1week ago, seen in the ER twice, given an antibiotic, now complains of sensation of pressure and redness noted    HPI Patient is in today as a hospital follow-up after being seen on 02/28/2023. She wen to the ED with right leg pain, swelling, and bruising. She admits to feeling an insect bite her but never seen it. She was started on an antibiotic and is on day 7. She is much better now. Continues to feel pressure. In her knee.   Review of Systems  Constitutional: Negative.   Respiratory: Negative.    Cardiovascular: Negative.   Musculoskeletal:        Right leg possible spider bite, healing   Neurological: Negative.   Psychiatric/Behavioral: Negative.    All other systems reviewed and are negative. Past Medical History:  Diagnosis Date  . Anemia   . 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 HSV (herpes simplex virus) anogenital infection   . PID (acute pelvic inflammatory disease) 01/11/2011    Social History   Socioeconomic History  . Marital status: Single    Spouse name: Not on file  . Number of children: Not on file  . Years of education: Not on file  . Highest education level: Not on file  Occupational History  . Not on file  Tobacco Use  . Smoking status: Never  . Smokeless tobacco: Never  Vaping Use  . Vaping Use: Never used  Substance and Sexual Activity  . Alcohol use: No  . Drug use: No  . Sexual activity: Not Currently  Other Topics Concern  . Not on file  Social History Narrative   ? Ets, in the past    Mom smoked and now has stopped this year.   Non smoker    Works [employer] recently separated and divorcing stressful.  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       Was   5 # 9 oz FT  gestation   Social Determinants of Health   Financial Resource Strain: Not on file  Food Insecurity: Not on file  Transportation Needs: Not on file  Physical Activity: Not on file  Stress: Not on file  Social Connections: Not on file  Intimate Partner Violence: Not on file    No past surgical history on file.  Family History  Problem Relation Age of Onset  . Hypertension Other   . Gallbladder disease Mother   . Diabetes Other   . Asthma Other   . Anemia Other   . Allergies Other   . Migraines Other     Allergies  Allergen Reactions  . Coconut (Cocos Nucifera) Itching  . Doxycycline Hives  . Peanut (Diagnostic) Itching    Current Outpatient Medications on File Prior to Visit  Medication Sig Dispense Refill  . amLODipine (NORVASC) 2.5 MG tablet TAKE ONE TABLET BY MOUTH ONE TIME DAILY FOR ELEVATED BLOOD PRESSURE 90 tablet 3  . cephALEXin (KEFLEX) 500 MG capsule Take 1 capsule (500 mg total) by mouth 2 (two) times daily for 7 days. 14 capsule 0  . cetirizine (ZYRTEC) 10 MG tablet Take 10 mg by mouth daily.    . Cyanocobalamin (  VITAMIN B12 PO) Take by mouth.    . Multiple Vitamins-Minerals (ZINC PO) Take by mouth.    Marland Kitchen VITAMIN D PO Take by mouth.    Marland Kitchen albuterol (VENTOLIN HFA) 108 (90 Base) MCG/ACT inhaler INHALE 1 TO 2 PUFFS INTO THE LUNGS EVERY 6 HOURS AS NEEDED FOR WHEEZING OR SHORTNESS OF BREATH 18 g 1   No current facility-administered medications on file prior to visit.    BP 118/82 (BP Location: Left Arm, Patient Position: Sitting, Cuff Size: Large)   Pulse 70   Temp 98.4 F (36.9 C) (Oral)   Ht 5\' 2"  (1.575 m)   Wt 242 lb 6.4 oz (110 kg)   LMP 02/19/2023 (Approximate)   SpO2 98%   BMI 44.34 kg/m chart      Objective:    BP 118/82 (BP Location: Left Arm, Patient Position: Sitting, Cuff Size: Large)   Pulse 70   Temp 98.4 F (36.9 C) (Oral)   Ht 5\' 2"  (1.575 m)   Wt 242 lb 6.4 oz (110 kg)   LMP 02/19/2023 (Approximate)   SpO2 98%   BMI  44.34 kg/m    Physical Exam Vitals and nursing note reviewed.  Constitutional:      Appearance: Normal appearance. She is obese.  Cardiovascular:     Rate and Rhythm: Normal rate and regular rhythm.  Pulmonary:     Effort: Pulmonary effort is normal.     Breath sounds: Normal breath sounds.  Musculoskeletal:        General: Normal range of motion.       Legs:     Comments: Bruising noted. Mild swelling, Mildly tender to palpation.   Skin:    General: Skin is warm and dry.  Neurological:     General: No focal deficit present.     Mental Status: She is alert and oriented to person, place, and time.  Psychiatric:        Mood and Affect: Mood normal.        Behavior: Behavior normal.        Thought Content: Thought content normal.   No results found for any visits on 03/05/23.      Assessment & Plan:   Problem List Items Addressed This Visit   None Visit Diagnoses     Acute pain of right knee    -  Primary       No orders of the defined types were placed in this encounter.  Call the office if symptoms worsen or persist. Recheck as scheduled and sooner as needed.  No follow-ups on file.  Eulis Foster, FNP

## 2023-05-16 ENCOUNTER — Emergency Department (HOSPITAL_BASED_OUTPATIENT_CLINIC_OR_DEPARTMENT_OTHER)
Admission: EM | Admit: 2023-05-16 | Discharge: 2023-05-16 | Disposition: A | Discharge status: Home / Self Care | Payer: Commercial Managed Care - PPO | Attending: Emergency Medicine | Admitting: Emergency Medicine

## 2023-05-16 ENCOUNTER — Encounter (HOSPITAL_BASED_OUTPATIENT_CLINIC_OR_DEPARTMENT_OTHER): Payer: Self-pay | Admitting: Emergency Medicine

## 2023-05-16 ENCOUNTER — Other Ambulatory Visit: Payer: Self-pay

## 2023-05-16 DIAGNOSIS — J029 Acute pharyngitis, unspecified: Secondary | ICD-10-CM | POA: Diagnosis not present

## 2023-05-16 DIAGNOSIS — Z91010 Allergy to peanuts: Secondary | ICD-10-CM | POA: Diagnosis not present

## 2023-05-16 HISTORY — DX: Essential (primary) hypertension: I10

## 2023-05-16 LAB — GROUP A STREP BY PCR: Group A Strep by PCR: NOT DETECTED

## 2023-05-16 MED ORDER — DEXAMETHASONE 4 MG PO TABS
8.0000 mg | ORAL_TABLET | Freq: Once | ORAL | Status: AC
  Administered 2023-05-16: 8 mg via ORAL
  Filled 2023-05-16: qty 2

## 2023-05-16 NOTE — ED Provider Notes (Signed)
[place] EMERGENCY DEPARTMENT AT [facility] Provider Note   CSN: [id number] Arrival date & time: 05/16/23  1734    History  Chief Complaint  Patient presents with   Sore Throat    Alyssa Lloyd is a 32 y.o. female for evaluation of sore throat over the last 3 days.  No change in voice.  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  She also has a mild cough.  No fever, congestion, rhinorrhea, chest pain, shortness of breath, nausea, vomiting.  No meds at home.  Works at hotel, unknown sick contacts.  Sore throat is bilateral.  She is concerned that she is a singer and has a performance in 1 week and that her voice will change    HPI     Home Medications Prior to Admission medications   Medication Sig Start Date End Date Taking? Authorizing Provider  albuterol (VENTOLIN HFA) 108 (90 Base) MCG/ACT inhaler INHALE 1 TO 2 PUFFS INTO THE LUNGS EVERY 6 HOURS AS NEEDED FOR WHEEZING OR SHORTNESS OF BREATH 03/05/21 03/05/22  Panosh, Neta Mends, MD  amLODipine (NORVASC) 2.5 MG tablet TAKE ONE TABLET BY MOUTH ONE TIME DAILY FOR ELEVATED BLOOD PRESSURE 01/10/23   Worthy Rancher B, FNP  cetirizine (ZYRTEC) 10 MG tablet Take 10 mg by mouth daily.    [provider]  Cyanocobalamin (VITAMIN B12 PO) Take by mouth.    [provider]  Multiple Vitamins-Minerals (ZINC PO) Take by mouth.    [provider]  VITAMIN D PO Take by mouth.    [provider]      Allergies    Coconut (cocos nucifera), Doxycycline, and Peanut (diagnostic)    Review of Systems   Review of Systems  Constitutional: Negative.   HENT:  Positive for sore throat. Negative for congestion, ear discharge, ear pain, facial swelling, nosebleeds, postnasal drip, rhinorrhea, sinus pressure, sinus pain, sneezing, tinnitus, trouble swallowing and voice change.   Respiratory:  Positive for cough.   Cardiovascular: Negative.    Gastrointestinal: Negative.   Genitourinary: Negative.   Musculoskeletal: Negative.   Skin: Negative.   Neurological: Negative.   All other systems reviewed and are negative.   Physical Exam Updated Vital Signs BP (!) 130/99   Pulse 77   Temp 98.6 F (37 C)   Resp 18   Ht 5\' 2"  (1.575 m)   Wt 110.7 kg   LMP 05/09/2023   SpO2 100%   BMI 44.63 kg/m  Physical Exam Vitals and nursing note reviewed.  Constitutional:      General: She is not in acute distress.    Appearance: She is well-developed. She is not ill-appearing, toxic-appearing or diaphoretic.  HENT:     Head: Normocephalic and atraumatic.     Mouth/Throat:     Mouth: Mucous membranes are moist. No oral lesions.     Pharynx: Uvula midline. Posterior oropharyngeal erythema present. No pharyngeal swelling, oropharyngeal exudate or uvula swelling.     Tonsils: No tonsillar exudate or tonsillar abscesses. 1+ on the right. 1+ on the left.     Comments: Uvula midline.  Tonsils 1+ bilaterally without exudate.  Posterior oropharyngeal erythema without exudate.  No pooling of secretions.  No change in voice.  No stridor. Eyes:     Pupils: Pupils are equal, round, and reactive to light.  Cardiovascular:     Rate and Rhythm: Normal rate.  Heart sounds: Normal heart sounds.  Pulmonary:     Effort: Pulmonary effort is normal. No respiratory distress.     Breath sounds: Normal breath sounds.  Abdominal:     General: Bowel sounds are normal. There is no distension.     Palpations: Abdomen is soft.  Musculoskeletal:        General: Normal range of motion.     Cervical back: Normal range of motion.  Skin:    General: Skin is warm and dry.     Capillary Refill: Capillary refill takes less than 2 seconds.  Neurological:     General: No focal deficit present.     Mental Status: She is alert.  Psychiatric:        Mood and Affect: Mood normal.     ED Results / Procedures / Treatments   Labs (all labs ordered are  listed, but only abnormal results are displayed) Labs Reviewed  GROUP A STREP BY PCR    EKG None  Radiology No results found.  Procedures Procedures    Medications Ordered in ED Medications  dexamethasone (DECADRON) tablet 8 mg (8 mg Oral Given 05/16/23 2030)    ED Course/ Medical Decision Making/ A&P   32 year old here for evaluation of sore throat over the last 3 days.  She is afebrile, nonseptic, not ill-appearing.  Posterior oropharyngeal erythema however no exudates.  Uvula is midline.  No pooling of secretions.  No evidence of PTA or RPA.  No change in voice, stridor.  Heart and lungs are clear. Abdominal soft, tender.  She does admit to a mild nonproductive cough.  No chest pain or shortness of breath.  She appears well-hydrated.  Labs personally viewed and interpreted Strep negative  Discussed results with patient.  Given Decadron for symptomatic management.  Her main concern is she sings on the weekend and she was concerned that her voice would change before her performance.  Discussed her current exam, unable to tell what her voice to be like in 1 week from now however hopefully her symptoms would improve with steroids, symptomatic management.  At this time I low suspicion for PTA, RPA, deep space infection infection requiring antibiotics, stridor.  Will have her follow-up outpatient, return for new or worsening symptoms.  The patient has been appropriately medically screened and/or stabilized in the ED. I have low suspicion for any other emergent medical condition which would require further screening, evaluation or treatment in the ED or require inpatient management.  Patient is hemodynamically stable and in no acute distress.  Patient able to ambulate in department prior to ED.  Evaluation does not show acute pathology that would require ongoing or additional emergent interventions while in the emergency department or further inpatient treatment.  I have discussed the  diagnosis with the patient and answered all questions.  Pain is been managed while in the emergency department and patient has no further complaints prior to discharge.  Patient is comfortable with plan discussed in room and is stable for discharge at this time.  I have discussed strict return precautions for returning to the emergency department.  Patient was encouraged to follow-up with PCP/specialist refer to at discharge.                             Medical Decision Making Amount and/or Complexity of Data Reviewed External Data Reviewed: labs, radiology and notes. Labs: ordered. Decision-making details documented in ED Course.  Risk OTC drugs.  Prescription drug management. Decision regarding hospitalization. Diagnosis or treatment significantly limited by social determinants of health.          Final Clinical Impression(s) / ED Diagnoses Final diagnoses:  Sore throat    Rx / DC Orders ED Discharge Orders     None         Blaike Vickers A, PA-C 05/16/23 2051    Melene Plan, DO 05/16/23 2057

## 2023-05-16 NOTE — ED Triage Notes (Signed)
Pt arrives pov, steady gait, endorses scratchy throat with difficulty swallowing today.

## 2023-05-16 NOTE — Discharge Instructions (Signed)
Pleasure taking care of you here in the emergency department  Your strep test was negative.  This is likely a viral infection.  We have given you a dose of steroids.  This last about 3 days.  You may do Tylenol Motrin as needed at home as well as warm salt water gargles.  Follow-up outpatient, return for new or worsening symptoms

## 2024-01-13 ENCOUNTER — Other Ambulatory Visit (HOSPITAL_COMMUNITY): Payer: Self-pay

## 2024-01-13 ENCOUNTER — Other Ambulatory Visit: Payer: Self-pay

## 2024-01-13 ENCOUNTER — Other Ambulatory Visit: Payer: Self-pay | Admitting: Family

## 2024-01-13 ENCOUNTER — Other Ambulatory Visit: Payer: Self-pay | Admitting: Internal Medicine

## 2024-01-13 MED ORDER — AMLODIPINE BESYLATE 2.5 MG PO TABS
2.5000 mg | ORAL_TABLET | Freq: Every day | ORAL | 0 refills | Status: DC
  Filled 2024-01-13: qty 30, 30d supply, fill #0

## 2024-01-13 NOTE — Telephone Encounter (Signed)
 Contacted. Pt has not seen with pcp since September of 2023.   Cpe appt scheduled for 02/03/2024.

## 2024-01-13 NOTE — Telephone Encounter (Signed)
 Copied from CRM [id number]. Topic: Clinical - Medication Refill >> Jan 13, 2024 10:26 AM Drema Balzarine wrote: Most Recent Primary Care Visit:  Provider: Worthy Rancher B  Department: [facility]  Visit Type: OFFICE VISIT  Date: 03/05/2023  Medication: amLODipine   Has the patient contacted their pharmacy? Yes (Agent: If no, request that the patient contact the pharmacy for the refill. If patient does not wish to contact the pharmacy document the reason why and proceed with request.) (Agent: If yes, when and what did the pharmacy advise?)  Is this the correct pharmacy for this prescription? Yes If no, delete pharmacy and type the correct one.  This is the patient's preferred pharmacy:    [facility] [address] [place], [place] - [address] [place] AT [facility] [address] [place]. [place] [address] Phone: [phone] Fax: [phone]   Has the prescription been filled recently? Yes  Is the patient out of the medication? Yes  Has the patient been seen for an appointment in the last year OR does the patient have an upcoming appointment? Yes  Can we respond through MyChart? No  Agent: Please be advised that Rx refills may take up to 3 business days. We ask that you follow-up with your pharmacy.

## 2024-01-14 ENCOUNTER — Telehealth: Payer: Self-pay

## 2024-01-14 NOTE — Telephone Encounter (Signed)
 Copied from CRM [id number]. Topic: Clinical - Prescription Issue >> Jan 14, 2024  3:31 PM Elizebeth Brooking wrote: Reason for CRM: Patient called in stating pharmacy stated that the prescription was ready but once she got there she was unable to get it  amLODipine (NORVASC) 2.5 MG tablet

## 2024-01-16 ENCOUNTER — Other Ambulatory Visit: Payer: Self-pay

## 2024-01-16 ENCOUNTER — Other Ambulatory Visit (HOSPITAL_COMMUNITY): Payer: Self-pay

## 2024-01-16 MED ORDER — AMLODIPINE BESYLATE 2.5 MG PO TABS: 2.5000 mg | ORAL_TABLET | Freq: Every day | ORAL | 0 refills | Status: DC

## 2024-01-16 NOTE — Telephone Encounter (Signed)
 Contacted [facility] and spoke to [place]. He states pt prescription is ready.   Inform pt. Pt states she is no longer uses [facility] but is using [facility].   Rx resent to [facility] pharmacy.

## 2024-02-03 ENCOUNTER — Ambulatory Visit (INDEPENDENT_AMBULATORY_CARE_PROVIDER_SITE_OTHER): Admitting: Internal Medicine

## 2024-02-03 VITALS — BP 130/84 | HR 78 | Temp 98.0°F | Ht 62.75 in | Wt 211.6 lb

## 2024-02-03 DIAGNOSIS — T7840XS Allergy, unspecified, sequela: Secondary | ICD-10-CM | POA: Diagnosis not present

## 2024-02-03 DIAGNOSIS — D649 Anemia, unspecified: Secondary | ICD-10-CM | POA: Diagnosis not present

## 2024-02-03 DIAGNOSIS — R102 Pelvic and perineal pain: Secondary | ICD-10-CM | POA: Diagnosis not present

## 2024-02-03 DIAGNOSIS — Z0000 Encounter for general adult medical examination without abnormal findings: Secondary | ICD-10-CM

## 2024-02-03 DIAGNOSIS — I10 Essential (primary) hypertension: Secondary | ICD-10-CM

## 2024-02-03 DIAGNOSIS — Z79899 Other long term (current) drug therapy: Secondary | ICD-10-CM

## 2024-02-03 MED ORDER — AMLODIPINE BESYLATE 2.5 MG PO TABS: 2.5000 mg | ORAL_TABLET | Freq: Every day | ORAL | 2 refills | Status: AC

## 2024-02-03 MED ORDER — ALBUTEROL SULFATE HFA 108 (90 BASE) MCG/ACT IN AERS: INHALATION_SPRAY | RESPIRATORY_TRACT | 1 refills | Status: AC

## 2024-02-03 NOTE — Progress Notes (Signed)
 Chief Complaint  Patient presents with   Annual Exam    Pt reports she is not fasting.     HPI: Patient  Alyssa Lloyd  33 y.o. comes in today for Preventive Health Care visit   Ask about refill albuterol  uses in spring otherwise ok   ? Allergy to some nuts?noted with  pink drinks  at [employer]  got itching and nuts .  Took  benadryl.    With help. Noted on some other foots that may have been nut related  Needs refill amlodipine  bp ok no se   Health Maintenance  Topic Date Due   COVID-19 Vaccine (1) 02/19/2024 (Originally 09/03/1996)   INFLUENZA VACCINE  06/04/2024   DTaP/Tdap/Td (2 - Tdap) 04/03/2025   Cervical Cancer Screening (HPV/Pap Cotest)  08/08/2025   Hepatitis C Screening  Completed   HIV Screening  Completed   HPV VACCINES  Aged Out   Health Maintenance Review LIFESTYLE:  Exercise:  work.  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 Cause   ROS:  GEN/ HEENT: No fever, significant weight changes sweats headaches vision problems hearing changes, CV/ PULM; No chest pain shortness of breath cough, syncope,edema  change in exercise tolerance. GI /GU: No adominal pain, vomiting, change in bowel habits. No blood in the stool. SKIN/HEME: ,no acute skin rashes suspicious lesions or bleeding. No lymphadenopathy, nodules, masses.  NEURO/ PSYCH:  No neurologic signs such as weakness numbness. No depression anxiety. IMM/ Allergy: No unusual infections.  Allergy .  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 No pertinent surgical history.  Family History  Problem Relation Age of Onset   Hypertension Other    Gallbladder disease Mother    Diabetes Other    Asthma Other    Anemia Other    Allergies Other    Migraines Other     Social History   Socioeconomic History   Marital status: Single    Spouse name: Not on file   Number of children: Not on file   Years of education: Not on file   Highest education level: Associate degree: academic program  Occupational History   Not on file  Tobacco Use   Smoking status: Never   Smokeless tobacco: Never  Vaping Use   Vaping status: Never Used  Substance and Sexual Activity   Alcohol use: No   Drug use: No   Sexual activity: Not Currently  Other Topics Concern   Not on file  Social History Narrative   ? Ets, in the past    Mom smoked and now has stopped this year.   Non smoker    Works [employer] recently separated and divorcing stressful.  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       Was  5 # 9 oz FT  gestation   Social Drivers of Health   Financial Resource Strain: Low Risk  (02/03/2024)   Overall Financial Resource Strain (CARDIA)    Difficulty of Paying Living Expenses: Not hard at all  Food Insecurity: No Food Insecurity (02/03/2024)   Hunger Vital Sign    Worried About Running Out of Food in the Last Year: Never true    Ran Out of Food in the Last Year: Never true  Transportation Needs: No Transportation Needs (02/03/2024)   PRAPARE - Administrator, Civil Service (Medical): No    Lack of Transportation (Non-Medical): No  Physical Activity: Unknown (02/03/2024)   Exercise Vital Sign    Days of Exercise per Week: 0 days    Minutes of Exercise per Session: Not on file  Stress: No Stress Concern Present (02/03/2024)   [employer] of Occupational Health - Occupational Stress Questionnaire    Feeling of Stress : Not at all  Social Connections:  Moderately Isolated (02/03/2024)   Social Connection and Isolation Panel [NHANES]    Frequency of Communication with Friends and Family: Twice a week    Frequency of Social Gatherings with Friends and Family: Once a week    Attends Religious Services: More than 4 times per year    Active Member of [employer] or Organizations: No    Attends Engineer, structural: Not on file    Marital Status: Never married    Outpatient Medications Prior to Visit  Medication Sig Dispense Refill   cetirizine (ZYRTEC) 10 MG tablet Take 10 mg by mouth daily.     Cyanocobalamin (VITAMIN B12 PO) Take by mouth.     VITAMIN D PO Take by mouth.     amLODipine (NORVASC) 2.5 MG tablet Take 1 tablet (2.5 mg total) by mouth daily for elevated blood pressure. 30 tablet 0   Multiple Vitamins-Minerals (ZINC PO) Take by mouth. (Patient not taking: Reported on 02/03/2024)     albuterol (VENTOLIN HFA) 108 (90 Base) MCG/ACT inhaler INHALE 1 TO 2 PUFFS INTO THE LUNGS EVERY 6 HOURS AS NEEDED FOR WHEEZING OR SHORTNESS OF BREATH 18 g 1   No facility-administered medications prior to visit.     EXAM:  BP 130/84 (BP Location: Left Arm, Patient Position: Sitting, Cuff Size: Large)   Pulse 78   Temp 98 F (36.7 C) (Oral)   Ht 5' 2.75" (1.594 m)   Wt 211 lb 9.6 oz (96 kg)   LMP 01/16/2024 (Exact Date)   SpO2 95%   BMI 37.78 kg/m   Body mass index is 37.78 kg/m. Wt Readings from Last 3 Encounters:  02/03/24 211 lb 9.6 oz (96 kg)  05/16/23 244 lb (110.7 kg)  03/05/23 242 lb 6.4 oz (110 kg)   BP Readings from Last 3 Encounters:  02/03/24 130/84  05/16/23 (!) 136/100  03/05/23 118/82    Physical Exam: Vital signs reviewed JYN:WGNF is a well-developed well-nourished alert cooperative    who appearsr stated age in no acute distress.  HEENT: normocephalic atraumatic , Eyes: PERRL EOM's full, conjunctiva clear, Nares: paten,t no deformity discharge or tenderness., Ears: no deformity EAC's clear TMs with normal  landmarks. Mouth: clear OP, no lesions, edema.  Moist mucous membranes. Dentition in adequate repair. NECK: supple without masses, or bruits. Thyroid easily palpable  and no nodules felt  CHEST/PULM:  Clear to auscultation and percussion breath sounds equal no wheeze , rales or rhonchi. No chest wall deformities or tenderness. Breast: normal by inspection . No dimpling,  discharge, masses, tenderness or discharge . CV: PMI is nondisplaced, S1 S2 no gallops, murmurs, rubs. Peripheral pulses are full without delay.No JVD .  ABDOMEN: Bowel sounds normal nontender  No guard or rebound, no hepato splenomegal no CVA tenderness.   Extremtities:  No clubbing cyanosis or edema, no acute joint swelling or redness no focal atrophy NEURO:  Oriented x3, cranial nerves 3-12 appear to be intact, no obvious focal weakness,gait within normal limits no abnormal reflexes or asymmetrical SKIN: No acute rashes normal turgor, color, no bruising or petechiae. PSYCH: Oriented, good eye contact, no obvious depression anxiety, cognition and judgment appear normal. LN: no cervical axillary adenopathy  Lab Results  Component Value Date   WBC 6.2 09/29/2022   HGB 10.7 (L) 09/29/2022   HCT 34.8 (L) 09/29/2022   PLT 402 (H) 09/29/2022   GLUCOSE 92 09/29/2022   CHOL 158 07/24/2020   TRIG 83 07/24/2020   HDL 46 (L) 07/24/2020   LDLCALC 95 07/24/2020   ALT 10 09/17/2021   AST 16 09/17/2021   NA 138 09/29/2022   K 3.4 (L) 09/29/2022   CL 107 09/29/2022   CREATININE 0.84 09/29/2022   BUN 15 09/29/2022   CO2 24 09/29/2022   TSH 1.54 09/17/2021   HGBA1C 5.7 09/17/2021    BP Readings from Last 3 Encounters:  02/03/24 130/84  05/16/23 (!) 136/100  03/05/23 118/82    Lab plan reviewed with patient   ASSESSMENT AND PLAN:  Discussed the following assessment and plan:    ICD-10-CM   1. Visit for preventive health examination  Z00.00 CBC with Differential/Platelet    Comprehensive metabolic panel with GFR     Lipid panel    TSH    Hemoglobin A1c    2. Mild anemia  D64.9 CBC with Differential/Platelet    Comprehensive metabolic panel with GFR    Lipid panel    TSH    Hemoglobin A1c    3. Pelvic pressure in female  R10.2 Ambulatory referral to Gynecology    4. 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 Essential hypertension  I10    refill amlodipine low dose    6. Medication management  Z79.899     Make lab appt  Will do referral allergy poss ? Nut other allergic response? Is using  avoidance and  benadryl as needed   Gyne referral ( has seen in remote past cannot remember where.)    pelvic fullness and concern with period that is new  she says ucg was negative . Did not repeat today as period ok  To make lab appt fasting as possible Return in about 1 year (around 02/02/2025) for depending on results.  Patient Care Team: Madelin Headings, MD as PCP - General Patient Instructions  Good to see you today  Will order allergy referral  for poss  nut allergy other  Will order albuterol for  you spring sx  if needed.   Will refiill amlodipine for bp control.  Make lab appt  for lab monitoring. Will do gyne referral for sx we discussed   Neta Mends. Haily Caley M.D.

## 2024-02-03 NOTE — Patient Instructions (Addendum)
 Good to see you today  Will order allergy referral  for poss  nut allergy other  Will order albuterol for  you spring sx  if needed.   Will refiill amlodipine for bp control.  Make lab appt  for lab monitoring. Will do gyne referral for sx we discussed

## 2024-02-04 ENCOUNTER — Other Ambulatory Visit

## 2024-02-10 ENCOUNTER — Other Ambulatory Visit

## 2024-02-27 ENCOUNTER — Ambulatory Visit: Payer: Self-pay | Admitting: Radiology

## 2024-05-01 ENCOUNTER — Encounter: Payer: Self-pay | Admitting: Internal Medicine

## 2024-05-01 DIAGNOSIS — D219 Benign neoplasm of connective and other soft tissue, unspecified: Secondary | ICD-10-CM

## 2024-05-02 ENCOUNTER — Encounter (HOSPITAL_COMMUNITY): Payer: Self-pay | Admitting: Emergency Medicine

## 2024-05-02 ENCOUNTER — Emergency Department (HOSPITAL_COMMUNITY)

## 2024-05-02 ENCOUNTER — Emergency Department (HOSPITAL_COMMUNITY)
Admission: EM | Admit: 2024-05-02 | Discharge: 2024-05-02 | Disposition: A | Discharge status: Home / Self Care | Attending: Emergency Medicine | Admitting: Emergency Medicine

## 2024-05-02 DIAGNOSIS — R109 Unspecified abdominal pain: Secondary | ICD-10-CM | POA: Diagnosis present

## 2024-05-02 DIAGNOSIS — Z91010 Allergy to peanuts: Secondary | ICD-10-CM | POA: Insufficient documentation

## 2024-05-02 DIAGNOSIS — D259 Leiomyoma of uterus, unspecified: Secondary | ICD-10-CM | POA: Insufficient documentation

## 2024-05-02 DIAGNOSIS — D219 Benign neoplasm of connective and other soft tissue, unspecified: Secondary | ICD-10-CM

## 2024-05-02 LAB — CBC WITH DIFFERENTIAL/PLATELET
Abs Immature Granulocytes: 0.01 10*3/uL (ref 0.00–0.07)
Basophils Absolute: 0 10*3/uL (ref 0.0–0.1)
Basophils Relative: 0 %
Eosinophils Absolute: 0.4 10*3/uL (ref 0.0–0.5)
Eosinophils Relative: 6 %
HCT: 31.3 % — ABNORMAL LOW (ref 36.0–46.0)
Hemoglobin: 9.2 g/dL — ABNORMAL LOW (ref 12.0–15.0)
Immature Granulocytes: 0 %
Lymphocytes Relative: 30 %
Lymphs Abs: 2.1 10*3/uL (ref 0.7–4.0)
MCH: 22.2 pg — ABNORMAL LOW (ref 26.0–34.0)
MCHC: 29.4 g/dL — ABNORMAL LOW (ref 30.0–36.0)
MCV: 75.4 fL — ABNORMAL LOW (ref 80.0–100.0)
Monocytes Absolute: 0.4 10*3/uL (ref 0.1–1.0)
Monocytes Relative: 6 %
Neutro Abs: 4 10*3/uL (ref 1.7–7.7)
Neutrophils Relative %: 58 %
Platelets: 441 10*3/uL — ABNORMAL HIGH (ref 150–400)
RBC: 4.15 MIL/uL (ref 3.87–5.11)
RDW: 16 % — ABNORMAL HIGH (ref 11.5–15.5)
WBC: 6.9 10*3/uL (ref 4.0–10.5)
nRBC: 0 % (ref 0.0–0.2)

## 2024-05-02 LAB — URINALYSIS, ROUTINE W REFLEX MICROSCOPIC
Bilirubin Urine: NEGATIVE
Glucose, UA: NEGATIVE mg/dL
Hgb urine dipstick: NEGATIVE
Ketones, ur: NEGATIVE mg/dL
Leukocytes,Ua: NEGATIVE
Nitrite: NEGATIVE
Protein, ur: NEGATIVE mg/dL
Specific Gravity, Urine: 1.026 (ref 1.005–1.030)
pH: 5 (ref 5.0–8.0)

## 2024-05-02 LAB — POC URINE PREG, ED: Preg Test, Ur: NEGATIVE

## 2024-05-02 LAB — COMPREHENSIVE METABOLIC PANEL WITH GFR
ALT: 11 U/L (ref 0–44)
AST: 16 U/L (ref 15–41)
Albumin: 4.1 g/dL (ref 3.5–5.0)
Alkaline Phosphatase: 52 U/L (ref 38–126)
Anion gap: 8 (ref 5–15)
BUN: 14 mg/dL (ref 6–20)
CO2: 23 mmol/L (ref 22–32)
Calcium: 8.9 mg/dL (ref 8.9–10.3)
Chloride: 105 mmol/L (ref 98–111)
Creatinine, Ser: 0.84 mg/dL (ref 0.44–1.00)
GFR, Estimated: >60 mL/min (ref >60)
Glucose, Bld: 92 mg/dL (ref 70–99)
Potassium: 3.4 mmol/L — ABNORMAL LOW (ref 3.5–5.1)
Sodium: 136 mmol/L (ref 135–145)
Total Bilirubin: 0.5 mg/dL (ref 0.0–1.2)
Total Protein: 7.7 g/dL (ref 6.5–8.1)

## 2024-05-02 MED ORDER — KETOROLAC TROMETHAMINE 30 MG/ML IJ SOLN
30.0000 mg | Freq: Once | INTRAMUSCULAR | Status: AC
  Administered 2024-05-02: 30 mg via INTRAMUSCULAR
  Filled 2024-05-02: qty 1

## 2024-05-02 MED ORDER — LIDOCAINE 5 % EX PTCH
1.0000 | MEDICATED_PATCH | CUTANEOUS | Status: DC
  Administered 2024-05-02: 1 via TRANSDERMAL
  Filled 2024-05-02: qty 1

## 2024-05-02 MED ORDER — DICLOFENAC SODIUM ER 100 MG PO TB24
100.0000 mg | ORAL_TABLET | Freq: Every day | ORAL | 0 refills | Status: DC
  Filled 2024-05-02: qty 7, 7d supply, fill #0

## 2024-05-02 NOTE — ED Triage Notes (Signed)
 Pt reports abdominal radiating to R flank pain 8/10. Started Saturday. Not taken OTC meds for pain. Pain with urination

## 2024-05-02 NOTE — ED Provider Notes (Signed)
 [place] EMERGENCY DEPARTMENT AT [facility] Provider Note   CSN: [id number] Arrival date & time: 05/02/24  9980     Patient presents with: Abdominal Pain   Alyssa Lloyd is a 33 y.o. female.   The history is provided by the patient.  Abdominal Pain Pain location:  R flank Pain radiates to:  Does not radiate Pain severity:  Severe Onset quality:  Sudden Duration:  1 day Timing:  Constant Progression:  Unchanged Chronicity:  New Context: not retching and not trauma   Relieved by:  Nothing Worsened by:  Nothing Ineffective treatments:  None tried Associated symptoms: no chest pain, no constipation, no diarrhea, no fever and no vomiting   Risk factors: no NSAID use        Prior to Admission medications   Medication Sig Start Date End Date Taking? Authorizing Provider  Diclofenac Sodium CR 100 MG 24 hr tablet Take 1 tablet (100 mg total) by mouth daily. 05/02/24  Yes Temprance Wyre, MD  albuterol  (VENTOLIN  HFA) 108 (90 Base) MCG/ACT inhaler INHALE 1 TO 2 PUFFS INTO THE LUNGS EVERY 6 HOURS AS NEEDED FOR WHEEZING OR SHORTNESS OF BREATH 02/03/24 02/02/25  Panosh, Apolinar POUR, MD  amLODipine  (NORVASC ) 2.5 MG tablet Take 1 tablet (2.5 mg total) by mouth daily for elevated blood pressure. 02/03/24   Panosh, Wanda K, MD  cetirizine (ZYRTEC) 10 MG tablet Take 10 mg by mouth daily.    [provider]  Cyanocobalamin (VITAMIN B12 PO) Take by mouth.    [provider]  Multiple Vitamins-Minerals (ZINC PO) Take by mouth. Patient not taking: Reported on 02/03/2024    [provider]  VITAMIN D  PO Take by mouth.    [provider]    Allergies: Other, Coconut (cocos nucifera), Doxycycline , and Peanut (diagnostic)    Review of Systems  Constitutional:  Negative for fever.  Cardiovascular:  Negative for chest pain.  Gastrointestinal:  Positive for abdominal pain. Negative for constipation, diarrhea and vomiting.  All other systems reviewed  and are negative.   Updated Vital Signs BP (!) 154/96   Pulse 68   Temp 98.5 F (36.9 C) (Oral)   Resp 19   LMP 04/15/2024   SpO2 100%   Physical Exam Vitals and nursing note reviewed.  Constitutional:      General: She is not in acute distress.    Appearance: Normal appearance. She is well-developed.  HENT:     Head: Normocephalic and atraumatic.     Nose: Nose normal.   Eyes:     Pupils: Pupils are equal, round, and reactive to light.    Cardiovascular:     Rate and Rhythm: Normal rate and regular rhythm.     Pulses: Normal pulses.     Heart sounds: Normal heart sounds.  Pulmonary:     Effort: Pulmonary effort is normal. No respiratory distress.     Breath sounds: Normal breath sounds.  Abdominal:     General: Bowel sounds are normal. There is no distension.     Palpations: Abdomen is soft.     Tenderness: There is no abdominal tenderness. There is no guarding or rebound.     Hernia: No hernia is present.   Musculoskeletal:        General: Normal range of motion.     Cervical back: Normal range of motion and neck supple.   Skin:    General: Skin is warm and dry.     Capillary Refill: Capillary refill  takes less than 2 seconds.     Findings: No erythema or rash.   Neurological:     General: No focal deficit present.     Mental Status: She is alert and oriented to person, place, and time.     Deep Tendon Reflexes: Reflexes normal.   Psychiatric:        Mood and Affect: Mood normal.     (all labs ordered are listed, but only abnormal results are displayed) Results for orders placed or performed during the hospital encounter of 05/02/24  Urinalysis, Routine w reflex microscopic -Urine, Clean Catch   Collection Time: 05/02/24 12:42 AM  Result Value Ref Range   Color, Urine YELLOW YELLOW   APPearance HAZY (A) CLEAR   Specific Gravity, Urine 1.026 1.005 - 1.030   pH 5.0 5.0 - 8.0   Glucose, UA NEGATIVE NEGATIVE mg/dL   Hgb urine dipstick NEGATIVE NEGATIVE    Bilirubin Urine NEGATIVE NEGATIVE   Ketones, ur NEGATIVE NEGATIVE mg/dL   Protein, ur NEGATIVE NEGATIVE mg/dL   Nitrite NEGATIVE NEGATIVE   Leukocytes,Ua NEGATIVE NEGATIVE  Comprehensive metabolic panel   Collection Time: 05/02/24  1:12 AM  Result Value Ref Range   Sodium 136 135 - 145 mmol/L   Potassium 3.4 (L) 3.5 - 5.1 mmol/L   Chloride 105 98 - 111 mmol/L   CO2 23 22 - 32 mmol/L   Glucose, Bld 92 70 - 99 mg/dL   BUN 14 6 - 20 mg/dL   Creatinine, Ser 9.15 0.44 - 1.00 mg/dL   Calcium 8.9 8.9 - 89.6 mg/dL   Total Protein 7.7 6.5 - 8.1 g/dL   Albumin 4.1 3.5 - 5.0 g/dL   AST 16 15 - 41 U/L   ALT 11 0 - 44 U/L   Alkaline Phosphatase 52 38 - 126 U/L   Total Bilirubin 0.5 0.0 - 1.2 mg/dL   GFR, Estimated >39 >39 mL/min   Anion gap 8 5 - 15  CBC with Differential   Collection Time: 05/02/24  1:12 AM  Result Value Ref Range   WBC 6.9 4.0 - 10.5 K/uL   RBC 4.15 3.87 - 5.11 MIL/uL   Hemoglobin 9.2 (L) 12.0 - 15.0 g/dL   HCT 68.6 (L) 63.9 - 53.9 %   MCV 75.4 (L) 80.0 - 100.0 fL   MCH 22.2 (L) 26.0 - 34.0 pg   MCHC 29.4 (L) 30.0 - 36.0 g/dL   RDW 83.9 (H) 88.4 - 84.4 %   Platelets 441 (H) 150 - 400 K/uL   nRBC 0.0 0.0 - 0.2 %   Neutrophils Relative % 58 %   Neutro Abs 4.0 1.7 - 7.7 K/uL   Lymphocytes Relative 30 %   Lymphs Abs 2.1 0.7 - 4.0 K/uL   Monocytes Relative 6 %   Monocytes Absolute 0.4 0.1 - 1.0 K/uL   Eosinophils Relative 6 %   Eosinophils Absolute 0.4 0.0 - 0.5 K/uL   Basophils Relative 0 %   Basophils Absolute 0.0 0.0 - 0.1 K/uL   Immature Granulocytes 0 %   Abs Immature Granulocytes 0.01 0.00 - 0.07 K/uL  POC Urine Pregnancy, ED (not at [facility] or [facility])   Collection Time: 05/02/24  1:17 AM  Result Value Ref Range   Preg Test, Ur NEGATIVE NEGATIVE   CT Renal Stone Study Result Date: 05/02/2024 CLINICAL DATA:  Flank pain. EXAM: CT ABDOMEN AND PELVIS WITHOUT CONTRAST TECHNIQUE: Multidetector CT imaging of the abdomen and pelvis was performed following the  standard protocol  without IV contrast. RADIATION DOSE REDUCTION: This exam was performed according to the departmental dose-optimization program which includes automated exposure control, adjustment of the mA and/or kV according to patient size and/or use of iterative reconstruction technique. COMPARISON:  None Available. FINDINGS: Lower chest: No acute abnormality. Hepatobiliary: No focal liver abnormality is seen. No gallstones, gallbladder wall thickening, or biliary dilatation. Pancreas: Unremarkable. No pancreatic ductal dilatation or surrounding inflammatory changes. Spleen: Normal in size without focal abnormality. Adrenals/Urinary Tract: Adrenal glands are unremarkable. Kidneys are normal, without renal calculi, focal lesion, or hydronephrosis. The urinary bladder is poorly distended and subsequently limited in evaluation. Stomach/Bowel: Stomach is within normal limits. Appendix appears normal. No evidence of bowel wall thickening, distention, or inflammatory changes. Vascular/Lymphatic: No significant vascular findings are present. No enlarged abdominal or pelvic lymph nodes. Reproductive: The uterus is enlarged and contains numerous large exophytic soft tissue masses. The largest measures 8.3 cm x 8.9 cm x 9.3 cm and extends into the mid abdomen. Mass effect is seen along the dome of the urinary bladder. The bilateral adnexa are unremarkable. Other: No abdominal wall hernia or abnormality. No abdominopelvic ascites. Musculoskeletal: No acute or significant osseous findings. IMPRESSION: 1. Enlarged uterus with numerous large exophytic soft tissue masses, consistent with uterine fibroids. 2. No evidence of renal calculi. Electronically Signed   By: Suzen Dials M.D.   On: 05/02/2024 02:37     Radiology: CT Renal Stone Study Result Date: 05/02/2024 CLINICAL DATA:  Flank pain. EXAM: CT ABDOMEN AND PELVIS WITHOUT CONTRAST TECHNIQUE: Multidetector CT imaging of the abdomen and pelvis was performed  following the standard protocol without IV contrast. RADIATION DOSE REDUCTION: This exam was performed according to the departmental dose-optimization program which includes automated exposure control, adjustment of the mA and/or kV according to patient size and/or use of iterative reconstruction technique. COMPARISON:  None Available. FINDINGS: Lower chest: No acute abnormality. Hepatobiliary: No focal liver abnormality is seen. No gallstones, gallbladder wall thickening, or biliary dilatation. Pancreas: Unremarkable. No pancreatic ductal dilatation or surrounding inflammatory changes. Spleen: Normal in size without focal abnormality. Adrenals/Urinary Tract: Adrenal glands are unremarkable. Kidneys are normal, without renal calculi, focal lesion, or hydronephrosis. The urinary bladder is poorly distended and subsequently limited in evaluation. Stomach/Bowel: Stomach is within normal limits. Appendix appears normal. No evidence of bowel wall thickening, distention, or inflammatory changes. Vascular/Lymphatic: No significant vascular findings are present. No enlarged abdominal or pelvic lymph nodes. Reproductive: The uterus is enlarged and contains numerous large exophytic soft tissue masses. The largest measures 8.3 cm x 8.9 cm x 9.3 cm and extends into the mid abdomen. Mass effect is seen along the dome of the urinary bladder. The bilateral adnexa are unremarkable. Other: No abdominal wall hernia or abnormality. No abdominopelvic ascites. Musculoskeletal: No acute or significant osseous findings. IMPRESSION: 1. Enlarged uterus with numerous large exophytic soft tissue masses, consistent with uterine fibroids. 2. No evidence of renal calculi. Electronically Signed   By: Suzen Dials M.D.   On: 05/02/2024 02:37     Procedures   Medications Ordered in the ED  lidocaine  (LIDODERM ) 5 % 1 patch (1 patch Transdermal Patch Applied 05/02/24 0257)  ketorolac  (TORADOL ) 30 MG/ML injection 30 mg (30 mg Intramuscular  Given 05/02/24 0257)                                    Medical Decision Making Patient with right flank pain  Amount and/or Complexity of Data Reviewed External Data Reviewed: notes.    Details: Previous notes reviewed  Labs: ordered.    Details: Urine pregnancy negative.  Urine negative for UTI. Normal sodium 136, potassium 3.4 normal creatinine.  Normal white count. Normal white count 6.9, low hemoglobin 9.2  Radiology: ordered.  Risk Prescription drug management. Risk Details: Exam and vitals are benign and reassuring.  Differential includes Fibroid related pain of MSK pain in the flank.  Will treat with Voltaren and have patient follow up with GYN for ongoing care.  Stable for discharge.      Final diagnoses:  Fibroids    No signs of systemic illness or infection. The patient is nontoxic-appearing on exam and vital signs are within normal limits.  I have reviewed the triage vital signs and the nursing notes. Pertinent labs & imaging results that were available during my care of the patient were reviewed by me and considered in my medical decision making (see chart for details). After history, exam, and medical workup I feel the patient has been appropriately medically screened and is safe for discharge home. Pertinent diagnoses were discussed with the patient. Patient was given return precautions.      ED Discharge Orders          Ordered    Diclofenac Sodium CR 100 MG 24 hr tablet  Daily        05/02/24 0248               Adelene Polivka, MD 05/02/24 [id number]

## 2024-05-03 ENCOUNTER — Telehealth: Payer: Self-pay

## 2024-05-03 ENCOUNTER — Other Ambulatory Visit (HOSPITAL_COMMUNITY): Payer: Self-pay

## 2024-05-03 NOTE — Transitions of Care (Post Inpatient/ED Visit) (Signed)
   05/03/2024  Name: Alyssa Lloyd MRN: [id number] DOB: 03-11-1991  Today's TOC FU Call Status: Today's TOC FU Call Status:: Successful TOC FU Call Completed TOC FU Call Complete Date: 05/03/24 Patient's Name and Date of Birth confirmed.  Transition Care Management Follow-up Telephone Call Date of Discharge: 05/02/24 Discharge Facility: [facility]) Type of Discharge: Emergency Department How have you been since you were released from the hospital?: Same Any questions or concerns?: No  Items Reviewed: Did you receive and understand the discharge instructions provided?: Yes Medications obtained,verified, and reconciled?: Yes (Medications Reviewed) Any new allergies since your discharge?: No Dietary orders reviewed?: No Do you have support at home?: Yes People in Home [RPT]: parent(s)  Medications Reviewed Today: Medications Reviewed Today     Reviewed by Len Kluver, CMA (Certified Medical Assistant) on 05/03/24 at 0840  Med List Status: <None>   Medication Order Taking? Sig Documenting Provider Last Dose Status Informant  albuterol  (VENTOLIN  HFA) 108 (90 Base) MCG/ACT inhaler [id number] Yes INHALE 1 TO 2 PUFFS INTO THE LUNGS EVERY 6 HOURS AS NEEDED FOR WHEEZING OR SHORTNESS OF BREATH Panosh, Wanda K, MD  Active   amLODipine  (NORVASC ) 2.5 MG tablet [id number] Yes Take 1 tablet (2.5 mg total) by mouth daily for elevated blood pressure. Panosh, Wanda K, MD  Active   cetirizine (ZYRTEC) 10 MG tablet [id number] Yes Take 10 mg by mouth daily. [provider]  Active   Cyanocobalamin (VITAMIN B12 PO) [id number] Yes Take by mouth. [provider]  Active   Diclofenac Sodium CR 100 MG 24 hr tablet [id number] Yes Take 1 tablet (100 mg total) by mouth daily. Palumbo, April, MD  Active   Multiple Vitamins-Minerals (ZINC PO) [id number]  Take by mouth.  Patient not taking: Reported on 05/03/2024   [provider]  Active   VITAMIN D  PO [id number] Yes Take by  mouth. [provider]  Active             Home Care and Equipment/Supplies: Were Home Health Services Ordered?: No Any new equipment or medical supplies ordered?: No  Functional Questionnaire: Do you need assistance with bathing/showering or dressing?: No Do you need assistance with meal preparation?: No Do you need assistance with eating?: No Do you have difficulty maintaining continence: No Do you need assistance with getting out of bed/getting out of a chair/moving?: No Do you have difficulty managing or taking your medications?: No  Follow up appointments reviewed: PCP Follow-up appointment confirmed?: Yes Date of PCP follow-up appointment?: 05/11/24 Follow-up Provider: Dr. Charlett Specialist [facility] Follow-up appointment confirmed?: No    SIGNATURE: Shawnae Leiva, CMA

## 2024-05-11 ENCOUNTER — Inpatient Hospital Stay: Admitting: Internal Medicine

## 2024-05-14 NOTE — Progress Notes (Signed)
   33 y.o. No obstetric history on file. female here for referral for fibroids. Single.  Patient's last menstrual period was 04/15/2024.    She reports ***. Urine sample provided: ***  Birth control: *** Last mammogram: never Sexually active: ***    GYN HISTORY: ***  OB History  No obstetric history on file.   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 Current Outpatient Medications on File Prior to Visit  Medication Sig Dispense Refill   albuterol  (VENTOLIN  HFA) 108 (90 Base) MCG/ACT inhaler INHALE 1 TO 2 PUFFS INTO THE LUNGS EVERY 6 HOURS AS NEEDED FOR WHEEZING OR SHORTNESS OF BREATH 18 g 1   amLODipine  (NORVASC ) 2.5 MG tablet Take 1 tablet (2.5 mg total) by mouth daily for elevated blood pressure. 90 tablet 2   cetirizine (ZYRTEC) 10 MG tablet Take 10 mg by mouth daily.     Cyanocobalamin (VITAMIN B12 PO) Take by mouth.     Diclofenac  Sodium CR 100 MG 24 hr tablet Take 1 tablet (100 mg total) by mouth daily. 7 tablet 0   Multiple Vitamins-Minerals (ZINC PO) Take by mouth. (Patient not taking: Reported on 05/03/2024)     VITAMIN D  PO Take by mouth.     No current facility-administered medications on file prior to visit.   Allergies  Allergen Reactions   Other Anaphylaxis, Itching and Swelling    Pt reports she noticed she has allergy to nuts- itching and swelling in throat.    Coconut (Cocos Nucifera) Itching   Doxycycline  Hives   Peanut (Diagnostic) Itching      PE There were no vitals filed for this visit. There is no height or weight on file to calculate BMI.  Physical Exam    Assessment and Plan:        There are no diagnoses linked to this encounter.  Clotilda FORBES Pa, CMA

## 2024-05-17 ENCOUNTER — Other Ambulatory Visit (HOSPITAL_COMMUNITY): Payer: Self-pay

## 2024-05-17 ENCOUNTER — Ambulatory Visit: Admitting: Obstetrics and Gynecology

## 2024-05-17 ENCOUNTER — Encounter: Payer: Self-pay | Admitting: Obstetrics and Gynecology

## 2024-05-17 VITALS — BP 124/78 | HR 71 | Temp 98.1°F | Ht 63.5 in | Wt 201.0 lb

## 2024-05-17 DIAGNOSIS — D500 Iron deficiency anemia secondary to blood loss (chronic): Secondary | ICD-10-CM

## 2024-05-17 DIAGNOSIS — N921 Excessive and frequent menstruation with irregular cycle: Secondary | ICD-10-CM

## 2024-05-17 DIAGNOSIS — D219 Benign neoplasm of connective and other soft tissue, unspecified: Secondary | ICD-10-CM | POA: Insufficient documentation

## 2024-05-17 MED ORDER — NORETHIN ACE-ETH ESTRAD-FE 1-20 MG-MCG PO TABS
1.0000 | ORAL_TABLET | Freq: Every day | ORAL | 0 refills | Status: DC
  Filled 2024-05-17 – 2024-06-04 (×2): qty 28, 28d supply, fill #0

## 2024-05-17 NOTE — Patient Instructions (Signed)
 Quick start method for birth control: recommend use of back-up protection for the first 7 days.

## 2024-05-17 NOTE — Assessment & Plan Note (Signed)
 As noted

## 2024-05-17 NOTE — Assessment & Plan Note (Addendum)
 Reviewed CT revealing: 15cm multifibroid uterus with 9cm pedunculated fibroid  Discussed pathology of fibroids and benign nature. Reviewed that management of fibroids is dependent upon associated symptoms. Asymptomatic fibroids can be managed expectantly. However, fibroids can cause AUB and bulk symptoms, including dysmenorrhea, pelvic and lower back pain and pressure, dyspareunia, and constipation.  Reviewed hormonal and nonhormonal management. Also reviewed interventional procedures like COL[place]IA and surgical management including myomectomy and hysterectomy.  Patient desires surgical management. Plan for COC +MTV w/ Fe with goal to proceed with myomectomy. Discussed use of COC. Being titrated off of HTN meds due to improved lifestyle changes. Taking every other day. No additional contraindications, migraines with aura, smoking, and hx of VTE/DVT. Discussed side effects including break through bleeding, mood changes, weight changes, headache, breast tenderness, nausea, and bloating. Warning signs including vision changes and leg swelling reviewed. Quick start method recommend with use of back-up protection for the first 7 days.  Hgb goal >10 RTO in 3 months to reassess

## 2024-05-27 ENCOUNTER — Other Ambulatory Visit (HOSPITAL_COMMUNITY): Payer: Self-pay

## 2024-06-01 ENCOUNTER — Inpatient Hospital Stay: Admitting: Internal Medicine

## 2024-06-04 ENCOUNTER — Other Ambulatory Visit (HOSPITAL_COMMUNITY): Payer: Self-pay

## 2024-08-17 ENCOUNTER — Ambulatory Visit: Admitting: Obstetrics and Gynecology

## 2024-09-14 ENCOUNTER — Ambulatory Visit: Admitting: Obstetrics and Gynecology

## 2024-09-14 NOTE — Progress Notes (Deleted)
 33 y.o. G44P0010 female with fibroids, menorrhagia and anemia here for f/u. Single, relationship for 1 yr. GYN care by PCP: Charlett Apolinar POUR, MD  No LMP recorded.   Q19-25d HVB: CD1-5 No IMB Menarche: 33yo  At 05/17/2024 appointment, she reported: She was seen in the ED 05/02/24 for acute RLQ pain, resolved since. No N/V. CT with enlarged multifibroid uterus, 15cm with 9cm pedunculated fibroid. No pain today Cycle completed, normal cramping with most recent cycle Tried the o-pill recently, used for 1 year. Stopped due to nausea.  She was started on Loestrin .  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 Current Outpatient Medications on File Prior to Visit  Medication Sig Dispense Refill   albuterol  (VENTOLIN  HFA) 108 (90 Base) MCG/ACT inhaler INHALE 1 TO 2 PUFFS INTO THE LUNGS EVERY 6 HOURS AS NEEDED FOR WHEEZING OR SHORTNESS OF BREATH 18 g 1   amLODipine  (NORVASC ) 2.5 MG tablet Take 1 tablet (2.5 mg total) by mouth daily for elevated blood pressure. 90 tablet 2   cetirizine (ZYRTEC) 10 MG tablet Take 10 mg by mouth daily.     Cyanocobalamin (VITAMIN B12 PO) Take by mouth.     Multiple Vitamins-Minerals (ZINC PO) Take by mouth.     norethindrone -ethinyl estradiol -FE (LOESTRIN  FE) 1-20 MG-MCG tablet Take 1 tablet by mouth daily. 112 tablet 0    VITAMIN D  PO Take by mouth.     No current facility-administered medications on file prior to visit.   Allergies  Allergen Reactions   Other Anaphylaxis, Itching and Swelling    Pt reports she noticed she has allergy to nuts- itching and swelling in throat.    Coconut (Cocos Nucifera) Itching   Doxycycline  Hives   Peanut (Diagnostic) Itching      PE There were no vitals filed for this visit.  There is no height or weight on file to calculate BMI.  Physical Exam Vitals reviewed.  Constitutional:      General: She is not in acute distress.    Appearance: Normal appearance.  HENT:     Head: Normocephalic and atraumatic.     Nose: Nose normal.  Eyes:     Extraocular Movements: Extraocular movements intact.     Conjunctiva/sclera: Conjunctivae normal.  Pulmonary:     Effort: Pulmonary effort is normal.  Abdominal:     General: There is no distension.     Palpations: Abdomen is soft. There is mass (fibroids palpated above umbilicus).     Tenderness: There is no abdominal tenderness. There is no guarding.  Musculoskeletal:        General: Normal range of motion.     Cervical back: Normal range of motion.  Neurological:  General: No focal deficit present.     Mental Status: She is alert.  Psychiatric:        Mood and Affect: Mood normal.        Behavior: Behavior normal.       Assessment and Plan:        There are no diagnoses linked to this encounter.   [name], MD

## 2024-10-19 ENCOUNTER — Encounter: Payer: Self-pay | Admitting: Obstetrics and Gynecology

## 2024-10-19 ENCOUNTER — Ambulatory Visit: Admitting: Obstetrics and Gynecology

## 2024-10-19 VITALS — BP 124/78 | HR 75 | Temp 98.6°F | Wt 201.0 lb

## 2024-10-19 DIAGNOSIS — D219 Benign neoplasm of connective and other soft tissue, unspecified: Secondary | ICD-10-CM | POA: Diagnosis not present

## 2024-10-19 DIAGNOSIS — R634 Abnormal weight loss: Secondary | ICD-10-CM | POA: Diagnosis not present

## 2024-10-19 DIAGNOSIS — N921 Excessive and frequent menstruation with irregular cycle: Secondary | ICD-10-CM | POA: Diagnosis not present

## 2024-10-19 DIAGNOSIS — D500 Iron deficiency anemia secondary to blood loss (chronic): Secondary | ICD-10-CM | POA: Diagnosis not present

## 2024-10-19 NOTE — Patient Instructions (Signed)
 Take 600-800mg  ibuprofen  30 minutes prior to your appointment.

## 2024-10-19 NOTE — Assessment & Plan Note (Signed)
 15cm multifibroid uterus with 9cm pedunculated fibroid Patient desires fertility sparing surgery in the form of myomectomy Initially planned for COC +MTV w/ Fe to improve Hgb, however left loestrin  prolonged cycles She is being titrated off of HTN meds. RTO for US  and EMB, consider 30mcg COC at that time No additional contraindications, migraines with aura, smoking, and hx of VTE/DVT. Check CBC today Hgb goal >10 for myomectomy

## 2024-10-19 NOTE — Progress Notes (Signed)
 33 y.o. [id number] female with menorrhagia, fibroids, anemia here for follow-up. Single, relationship for 1 yr. GYN care by PCP  Patient's last menstrual period was 10/03/2024 (exact date). Period Duration (Days): 5 Period Pattern: Regular Menstrual Flow: Moderate, Heavy Menstrual Control: Maxi pad, Tampon Dysmenorrhea: (!) Moderate Dysmenorrhea Symptoms: Diarrhea, Nausea, Headache Q19-25d HVB: CD1-5 No IMB Menarche: 33yo  She was seen in the ED 05/02/24 for acute RLQ pain, resolved since. No N/V. CT with enlarged multifibroid uterus, 15cm with 9cm pedunculated fibroid. Tried the o-pill recently, used for 1 year. Stopped due to nausea.  TSH wnl 2022 05/02/24 Hgb 9.2  5 month f/u due to passing of family members: Started on loestrin , she reports it was not working and menstrual period was lasting too long (2wk cycles twice a month). Stopped taking the pill after 3 months. Started taking MTV w/ iron but started having cold chills and finger tips turned blue. Only happens when she took the MTV. 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 No pertinent surgical history. Current Outpatient Medications on File Prior to Visit  Medication Sig Dispense Refill   albuterol  (VENTOLIN  HFA) 108 (90  Base) MCG/ACT inhaler INHALE 1 TO 2 PUFFS INTO THE LUNGS EVERY 6 HOURS AS NEEDED FOR WHEEZING OR SHORTNESS OF BREATH 18 g 1   amLODipine  (NORVASC ) 2.5 MG tablet Take 1 tablet (2.5 mg total) by mouth daily for elevated blood pressure. 90 tablet 2   cetirizine (ZYRTEC) 10 MG tablet Take 10 mg by mouth daily.     Cyanocobalamin (VITAMIN B12 PO) Take by mouth.     Multiple Vitamins-Minerals (ZINC PO) Take by mouth.     VITAMIN D  PO Take by mouth.     No current facility-administered medications on file prior to visit.   Allergies  Allergen Reactions   Other Anaphylaxis, Itching and Swelling    Pt reports she noticed she has allergy to nuts- itching and swelling in throat.    Coconut (Cocos Nucifera) Itching   Doxycycline  Hives   Peanut (Diagnostic) Itching      PE Today's Vitals   10/19/24 1556  BP: 124/78  Pulse: 75  Temp: 98.6 F (37 C)  TempSrc: Oral  SpO2: 99%  Weight: 201 lb (91.2 kg)   Body mass index is 35.05 kg/m.  Physical Exam Vitals reviewed.  Constitutional:      General: She is not in acute distress.    Appearance: Normal appearance.  HENT:     Head: Normocephalic and atraumatic.     Nose: Nose normal.  Eyes:     Extraocular Movements: Extraocular movements intact.     Conjunctiva/sclera: Conjunctivae normal.  Pulmonary:     Effort: Pulmonary effort is normal.  Musculoskeletal:        General: Normal range of motion.     Cervical back: Normal range of motion.  Neurological:     General: No focal deficit present.     Mental Status: She is alert.  Psychiatric:        Mood and Affect: Mood normal.        Behavior: Behavior normal.       Assessment and Plan:        Iron deficiency anemia due to chronic blood loss -     CBC -     Ferritin  Menometrorrhagia -     US  PELVIS TRANSVAGINAL NON-OB (TV ONLY); Future -     Endometrial biopsy; Future -     TSH  Fibroid Assessment & Plan: 15cm multifibroid uterus with 9cm pedunculated fibroid Patient  desires fertility sparing surgery in the form of myomectomy Initially planned for COC +MTV w/ Fe to improve Hgb, however left loestrin  prolonged cycles She is being titrated off of HTN meds. RTO for US  and EMB, consider 30mcg COC at that time No additional contraindications, migraines with aura, smoking, and hx of VTE/DVT. Check CBC today Hgb goal >10 for myomectomy  Orders: -     US  PELVIS TRANSVAGINAL NON-OB (TV ONLY); Future -     Endometrial biopsy; Future  Weight loss  40lb wt loss over 1.68yr Will check TSH, Recommend f/u with PCP   [name], MD

## 2024-10-20 LAB — CBC
HCT: 34.9 % — ABNORMAL LOW (ref 35.9–46.0)
Hemoglobin: 10.7 g/dL — ABNORMAL LOW (ref 11.7–15.5)
MCH: 24.2 pg — ABNORMAL LOW (ref 27.0–33.0)
MCHC: 30.7 g/dL — ABNORMAL LOW (ref 31.6–35.4)
MCV: 79 fL — ABNORMAL LOW (ref 81.4–101.7)
MPV: 11 fL (ref 7.5–12.5)
Platelets: 367 Thousand/uL (ref 140–400)
RBC: 4.42 Million/uL (ref 3.80–5.10)
RDW: 14.3 % (ref 11.0–15.0)
WBC: 5.9 Thousand/uL (ref 3.8–10.8)

## 2024-10-20 LAB — TSH: TSH: 1.44 m[IU]/L

## 2024-10-20 LAB — FERRITIN: Ferritin: 7 ng/mL — ABNORMAL LOW (ref 16–154)

## 2024-10-22 ENCOUNTER — Ambulatory Visit: Payer: Self-pay | Admitting: Obstetrics and Gynecology

## 2024-10-26 ENCOUNTER — Encounter: Payer: Self-pay | Admitting: Internal Medicine

## 2024-10-27 NOTE — Telephone Encounter (Signed)
 Contacted pt and schedule an appt with Dr. Charlett to be evaluate. Advise pt to proceed nearest UC if sx are worsen. Pt verbalized understanding.

## 2024-11-02 ENCOUNTER — Ambulatory Visit: Admitting: Internal Medicine

## 2024-11-03 ENCOUNTER — Other Ambulatory Visit: Admitting: Obstetrics and Gynecology

## 2024-11-03 ENCOUNTER — Other Ambulatory Visit

## 2024-11-18 ENCOUNTER — Other Ambulatory Visit: Admitting: Obstetrics and Gynecology

## 2024-11-18 ENCOUNTER — Other Ambulatory Visit

## 2024-12-30 ENCOUNTER — Other Ambulatory Visit: Admitting: Obstetrics and Gynecology

## 2024-12-30 ENCOUNTER — Other Ambulatory Visit
